# Patient Record
Sex: Female | Born: 1990 | Race: Black or African American | Hispanic: No | Marital: Single | Smoking: Former smoker
Health system: Southern US, Community
[De-identification: ages and names within clinical notes are randomized; demographics above are authoritative.]

## PROBLEM LIST (undated history)

## (undated) DIAGNOSIS — O1490 Unspecified pre-eclampsia, unspecified trimester: Secondary | ICD-10-CM

---

## 2017-04-30 ENCOUNTER — Encounter (HOSPITAL_COMMUNITY): Payer: Self-pay | Admitting: Emergency Medicine

## 2017-04-30 ENCOUNTER — Emergency Department (HOSPITAL_COMMUNITY)
Admission: EM | Admit: 2017-04-30 | Discharge: 2017-04-30 | Disposition: A | Discharge status: Home / Self Care | Payer: Medicaid Other | Attending: Physician Assistant | Admitting: Physician Assistant

## 2017-04-30 ENCOUNTER — Emergency Department (HOSPITAL_COMMUNITY): Payer: Medicaid Other

## 2017-04-30 ENCOUNTER — Other Ambulatory Visit: Payer: Self-pay

## 2017-04-30 DIAGNOSIS — M25562 Pain in left knee: Secondary | ICD-10-CM

## 2017-04-30 DIAGNOSIS — F17200 Nicotine dependence, unspecified, uncomplicated: Secondary | ICD-10-CM | POA: Insufficient documentation

## 2017-04-30 MED ORDER — IBUPROFEN 400 MG PO TABS
600.0000 mg | ORAL_TABLET | Freq: Once | ORAL | Status: AC
  Administered 2017-04-30: 600 mg via ORAL
  Filled 2017-04-30: qty 1

## 2017-04-30 NOTE — Discharge Instructions (Signed)
X-ray shows no evidence of fracture or dislocation, likely soft tissue injury from the fall.  Please treat with ibuprofen and Tylenol, ice, elevation and rest.  Pain should improve over the next few days if pain is not getting better please follow-up with primary care.  If you develop severe pain in the knee, swelling, redness, warmth numbness or tingling please return to the ED for reevaluation.

## 2017-04-30 NOTE — ED Provider Notes (Signed)
[facility] EMERGENCY [facility] Provider Note   CSN: [id number] Arrival date & time: 04/30/17  0844     History   Chief Complaint Chief Complaint  Patient presents with  . Knee Injury    HPI  Ariel Brown is a 27 y.o. obese female who is otherwise healthy, presents to the ED today for evaluation of left knee pain.  Patient reports she slipped yesterday and fell landing on her knee, since then she has had pain primarily with movement.  Patient has not tried any pain medications prior to arrival.  She has been ambulatory without difficulty.  Patient denies any numbness tingling or weakness to the lower extremity.  No swelling, redness or warmth.       History reviewed. No pertinent past medical history.  There are no active problems to display for this patient.   History reviewed. No pertinent surgical history.  OB History    No data available       Home Medications    Prior to Admission medications   Not on File    Family History History reviewed. No pertinent family history.  Social History Social History   Tobacco Use  . Smoking status: Current Every Day Smoker  . Smokeless tobacco: Current User  Substance Use Topics  . Alcohol use: Not on file  . Drug use: Not on file     Allergies   Patient has no known allergies.   Review of Systems Review of Systems  Constitutional: Negative for chills and fever.  Musculoskeletal: Positive for arthralgias (L knee). Negative for gait problem and joint swelling.  Neurological: Negative for weakness and numbness.     Physical Exam Updated Vital Signs BP (!) 119/53   Pulse 81   Temp 98.3 F (36.8 C) (Oral)   Resp 18   LMP 03/29/2017   SpO2 99%   Physical Exam  Constitutional: She appears well-developed and well-nourished. No distress.  HENT:  Head: Normocephalic and atraumatic.  Eyes: Right eye exhibits no discharge. Left eye exhibits no discharge.  Pulmonary/Chest: Effort  normal. No respiratory distress.  Musculoskeletal:  Left knee with mild tenderness to palpation over the patella, no appreciable deformity, swelling, redness or warmth, full extension and flexion of the knee with mild discomfort, DP and TP pulses 2+, sensation intact, normal strength with dorsi and plantarflexion as well as flexion and extension of the knee.  Neurological: She is alert. Coordination normal.  Skin: Skin is warm and dry. She is not diaphoretic.  Psychiatric: She has a normal mood and affect. Her behavior is normal.  Nursing note and vitals reviewed.    ED Treatments / Results  Labs (all labs ordered are listed, but only abnormal results are displayed) Labs Reviewed - No data to display  EKG  EKG Interpretation None       Radiology No results found.  Procedures Procedures (including critical care time)  Medications Ordered in ED Medications  ibuprofen (ADVIL,MOTRIN) tablet 600 mg (600 mg Oral Given 04/30/17 0941)     Initial Impression / Assessment and Plan / ED Course  I have reviewed the triage vital signs and the nursing notes.  Pertinent labs & imaging results that were available during my care of the patient were reviewed by me and considered in my medical decision making (see chart for details).  Patient presents for evaluation of left knee pain after fall yesterday, no swelling noted.  Patient has been ambulatory without difficulty.  Left lower extremity is  neurovascularly intact and x-ray shows no evidence of acute fracture or other abnormality.  Likely soft tissue injury recommend NSAIDs and rice therapy.  Patient stable for discharge home follow-up with her primary care doctor.  Return precautions discussed.  Final Clinical Impressions(s) / ED Diagnoses   Final diagnoses:  Acute pain of left knee    ED Discharge Orders    None       Legrand Rams 04/30/17 1015    Abelino Derrick, MD 05/03/17 1947

## 2017-04-30 NOTE — ED Triage Notes (Signed)
Pt sts she slipped and fell onto her L knee. Pain with movement. No additional swelling noted. Pt sts fall occurred yesterday. No problem with ambulation.

## 2017-04-30 NOTE — ED Notes (Signed)
Patient transported to X-ray 

## 2017-06-27 ENCOUNTER — Emergency Department (HOSPITAL_COMMUNITY)
Admission: EM | Admit: 2017-06-27 | Discharge: 2017-06-27 | Disposition: A | Discharge status: Home / Self Care | Payer: Medicaid Other | Source: Home / Self Care | Attending: Emergency Medicine | Admitting: Emergency Medicine

## 2017-06-27 ENCOUNTER — Other Ambulatory Visit: Payer: Self-pay

## 2017-06-27 ENCOUNTER — Emergency Department (HOSPITAL_COMMUNITY)
Admission: EM | Admit: 2017-06-27 | Discharge: 2017-06-27 | Disposition: A | Discharge status: Home / Self Care | Payer: Medicaid Other | Attending: Emergency Medicine | Admitting: Emergency Medicine

## 2017-06-27 ENCOUNTER — Encounter (HOSPITAL_COMMUNITY): Payer: Self-pay

## 2017-06-27 ENCOUNTER — Encounter (HOSPITAL_COMMUNITY): Payer: Self-pay | Admitting: Emergency Medicine

## 2017-06-27 DIAGNOSIS — S39012A Strain of muscle, fascia and tendon of lower back, initial encounter: Secondary | ICD-10-CM | POA: Diagnosis not present

## 2017-06-27 DIAGNOSIS — Y929 Unspecified place or not applicable: Secondary | ICD-10-CM | POA: Diagnosis not present

## 2017-06-27 DIAGNOSIS — L03011 Cellulitis of right finger: Secondary | ICD-10-CM | POA: Diagnosis not present

## 2017-06-27 DIAGNOSIS — X500XXA Overexertion from strenuous movement or load, initial encounter: Secondary | ICD-10-CM | POA: Insufficient documentation

## 2017-06-27 DIAGNOSIS — Y999 Unspecified external cause status: Secondary | ICD-10-CM | POA: Diagnosis not present

## 2017-06-27 DIAGNOSIS — F17200 Nicotine dependence, unspecified, uncomplicated: Secondary | ICD-10-CM | POA: Diagnosis not present

## 2017-06-27 DIAGNOSIS — S3982XA Other specified injuries of lower back, initial encounter: Secondary | ICD-10-CM | POA: Diagnosis present

## 2017-06-27 DIAGNOSIS — Y939 Activity, unspecified: Secondary | ICD-10-CM | POA: Diagnosis not present

## 2017-06-27 MED ORDER — NAPROXEN 500 MG PO TABS: 500.0000 mg | ORAL_TABLET | Freq: Two times a day (BID) | ORAL | 0 refills | Status: DC

## 2017-06-27 MED ORDER — CEPHALEXIN 500 MG PO CAPS: 500.0000 mg | ORAL_CAPSULE | Freq: Three times a day (TID) | ORAL | 0 refills | Status: DC

## 2017-06-27 MED ORDER — CEPHALEXIN 250 MG PO CAPS
500.0000 mg | ORAL_CAPSULE | Freq: Once | ORAL | Status: AC
  Administered 2017-06-27: 500 mg via ORAL
  Filled 2017-06-27: qty 2

## 2017-06-27 NOTE — ED Notes (Signed)
ED Provider at bedside. 

## 2017-06-27 NOTE — Discharge Instructions (Addendum)
Naprosyn for pain.  Keep your finger elevated.  Warm soapy water soaks several times a day.  Take antibiotic as prescribed until all gone.  Do your back exercises.  Follow-up with family doctor.

## 2017-06-27 NOTE — ED Provider Notes (Signed)
[facility] Provider Note   CSN: [id number] Arrival date & time: 06/27/17  2012     History   Chief Complaint Chief Complaint  Patient presents with  . Back Pain  . Hand Pain    HPI Ariel Brown is a 27 y.o. female.  HPI Ariel HarmanMary Aurel Klinkner is a 27 y.o. female presents to emergency department with complaints.  Patient states she has had lower back pain for the last several days.  She states that it is painful in the lower back and mainly on the right, worsened with leaning forward and picking things off the floor.  Denies any known injuries.  Denies any pain radiating down her legs.  Denies any trouble controlling bowels or bladder.  Denies any fever or chills.  No urinary symptoms.  Has not taken any medications prior to coming in.  Patient's other complaint is pain in her right index finger.  Patient states she bites her nails and feels like she may have ingrown nail or an infection.  She states this started several days ago, now much worse.  Denies any drainage.  States finger feels "like it is pulsating."   History reviewed. No pertinent past medical history.  There are no active problems to display for this patient.   History reviewed. No pertinent surgical history.   OB History   None      Home Medications    Prior to Admission medications   Not on File    Family History No family history on file.  Social History Social History   Tobacco Use  . Smoking status: Current Every Day Smoker  . Smokeless tobacco: Current User  Substance Use Topics  . Alcohol use: Yes    Comment: social  . Drug use: Not Currently     Allergies   Patient has no known allergies.   Review of Systems Review of Systems  Constitutional: Negative for chills and fever.  Respiratory: Negative for cough, chest tightness and shortness of breath.   Cardiovascular: Negative for chest pain, palpitations and leg swelling.    Gastrointestinal: Negative for abdominal pain, diarrhea, nausea and vomiting.  Genitourinary: Negative for dysuria, flank pain, pelvic pain, vaginal bleeding, vaginal discharge and vaginal pain.  Musculoskeletal: Positive for arthralgias and back pain. Negative for myalgias, neck pain and neck stiffness.  Skin: Positive for color change. Negative for rash.  Neurological: Negative for dizziness, weakness and headaches.  All other systems reviewed and are negative.    Physical Exam Updated Vital Signs BP (!) 133/92 (BP Location: Left Arm)   Pulse 95   Temp 98.3 F (36.8 C) (Oral)   Resp 20   Ht 5\' 5"  (1.651 m)   Wt 127 kg (280 lb)   LMP 06/23/2017   SpO2 100%   BMI 46.59 kg/m   Physical Exam  Constitutional: She appears well-developed and well-nourished. No distress.  HENT:  Head: Normocephalic.  Eyes: Conjunctivae are normal.  Neck: Neck supple.  Cardiovascular: Normal rate, regular rhythm and normal heart sounds.  Pulmonary/Chest: Effort normal and breath sounds normal. No respiratory distress. She has no wheezes. She has no rales.  Abdominal: Soft. Bowel sounds are normal. She exhibits no distension. There is no tenderness. There is no rebound.  Musculoskeletal: She exhibits no edema.  Tenderness to palpation over lower midline lumbar spine and right paraspinal muscles.  Pain with forward flexion of the torso.  Paronychia to the right index finger.  Full range of motion  of the joint.  Neurological: She is alert.  5/5 and equal lower extremity strength. 2+ and equal patellar reflexes bilaterally. Pt able to dorsiflex bilateral toes and feet with good strength against resistance. Equal sensation bilaterally over thighs and lower legs.   Skin: Skin is warm and dry.  Psychiatric: She has a normal mood and affect. Her behavior is normal.  Nursing note and vitals reviewed.    ED Treatments / Results  Labs (all labs ordered are listed, but only abnormal results are  displayed) Labs Reviewed - No data to display  EKG None  Radiology No results found.  Procedures Drain paronychia Date/Time: 06/27/2017 10:54 PM Performed by: Jaynie Crumble, PA-C Authorized by: Jaynie Crumble, PA-C  Consent: Verbal consent obtained. Risks and benefits: risks, benefits and alternatives were discussed Consent given by: patient Patient understanding: patient states understanding of the procedure being performed Patient consent: the patient's understanding of the procedure matches consent given Site marked: the operative site was marked Required items: required blood products, implants, devices, and special equipment available Patient identity confirmed: verbally with patient Time out: Immediately prior to procedure a "time out" was called to verify the correct patient, procedure, equipment, support staff and site/side marked as required. Preparation: Patient was prepped and draped in the usual sterile fashion. Local anesthesia used: no  Anesthesia: Local anesthesia used: no  Sedation: Patient sedated: no  Patient tolerance: Patient tolerated the procedure well with no immediate complications Comments: Large white purulent drainage    (including critical care time)  Medications Ordered in ED Medications - No data to display   Initial Impression / Assessment and Plan / ED Course  I have reviewed the triage vital signs and the nursing notes.  Pertinent labs & imaging results that were available during my care of the patient were reviewed by me and considered in my medical decision making (see chart for details).     Patient in emergency department with back pain.  Based on exam, back pain is most likely musculoskeletal.  She has no urinary symptoms or abdominal pain.  No CVA tenderness.  Pain is worsened with forward flexion.  No pain with straight leg raise.  Question muscle strain.  Advised to start NSAIDs.  We will give her enough for back  exercises.  Patient also has a paronychia to right index finger which was drained in emergency department.  Will start on antibiotic, discussed warm soapy water soaks, follow-up as needed.  Patient agreed.  Vitals:   06/27/17 2046 06/27/17 2054  BP: (!) 133/92   Pulse: 95   Resp: 20   Temp: 98.3 F (36.8 C)   TempSrc: Oral   SpO2: 100%   Weight:  127 kg (280 lb)  Height:  5\' 5"  (1.651 m)     Final Clinical Impressions(s) / ED Diagnoses   Final diagnoses:  Lumbosacral strain, initial encounter  Paronychia of right index finger    ED Discharge Orders        Ordered    cephALEXin (KEFLEX) 500 MG capsule  3 times daily     06/27/17 2310    naproxen (NAPROSYN) 500 MG tablet  2 times daily     06/27/17 2310       Jaynie Crumble, PA-C 06/27/17 2312    Mesner, Barbara Cower, MD 07/01/17 2137

## 2017-06-27 NOTE — ED Notes (Signed)
Pt departed in NAD, refused use of wheelchair.  

## 2017-06-27 NOTE — ED Notes (Addendum)
Patient inquired about wait time. This tech explained how wait times worked. Patient understanding. Patient then approached registration and stated she did not want to wait and left.

## 2017-06-27 NOTE — ED Triage Notes (Signed)
Pt states she has been having lower back pain for a couple of weeks, worse if she lays flat for a long period of time. Pt also c/o pain and swelling in left index finger. 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

## 2017-06-27 NOTE — ED Triage Notes (Signed)
Pt here earlier for same but LWBS, pt reports lower back pain X few weeks, pain is worse with bending over and moving. Pt also reports R finger pain, appears to might have ingrown nail.

## 2020-07-25 ENCOUNTER — Ambulatory Visit (HOSPITAL_COMMUNITY)
Admission: EM | Admit: 2020-07-25 | Discharge: 2020-07-25 | Disposition: A | Discharge status: Home / Self Care | Payer: Medicaid Other | Attending: Emergency Medicine | Admitting: Emergency Medicine

## 2020-07-25 ENCOUNTER — Encounter (HOSPITAL_COMMUNITY): Payer: Self-pay

## 2020-07-25 DIAGNOSIS — O169 Unspecified maternal hypertension, unspecified trimester: Secondary | ICD-10-CM

## 2020-07-25 MED ORDER — NIFEDIPINE ER OSMOTIC RELEASE 30 MG PO TB24: 30.0000 mg | ORAL_TABLET | Freq: Every day | ORAL | 1 refills | Status: DC

## 2020-07-25 NOTE — ED Triage Notes (Signed)
Pt c/o HBP, lower back pain and c/o seeing spots X 2 months. She states this started happening after giving birth.

## 2020-07-25 NOTE — ED Provider Notes (Signed)
[facility] Provider Note  ____________________________________________  Time seen: Approximately 8:27 AM  I have reviewed the triage vital signs and the nursing notes.   HISTORY  Chief Complaint Hypertension, Back Pain, and Eye Problem    HPI Ariel Brown is a 30 y.o. female who presents to the urgent care complaining of hypertension.  Patient states that she delivered 2 months ago.  Roughly a month ago, patient noticed that she was becoming symptomatic with headaches, body aches, generalized malaise.  She states that she had had an issue during her delivery with high blood pressure, was having magnesium administered during her delivery.  Patient states that she had no history of high blood pressure, never been diagnosed with preeclampsia/eclampsia during her pregnancy.  She states that the term eclampsia was mentioned while she was delivering but did not have any complications and was not started on antihypertensives on discharge.  Patient states that 3 to 4 weeks after delivering she started having symptoms, went to [facility] to check her blood pressure and it was 217 systolic and over 100 diastolic.  Patient states that she had 1 follow-up with OB/GYN but has not seen OB/GYN or primary care since her initial follow-up.  Patient states that she has noticed that her blood pressure has remained elevated but she is not currently having symptoms to include headaches, visual changes, numbness or weakness, seizure activity, chest pain, shortness of breath.  No medications again for this complaint.  She has not seen OB or primary care for this complaint either.         History reviewed. No pertinent past medical history.  There are no problems to display for this patient.   History reviewed. No pertinent surgical history.  Prior to Admission medications   Medication Sig Start Date End Date Taking? Authorizing Provider  NIFEdipine (PROCARDIA-XL/NIFEDICAL-XL) 30 MG 24 hr  tablet Take 1 tablet (30 mg total) by mouth daily. 07/25/20  Yes Amyri Frenz, Delorise Royals, PA-C  cephALEXin (KEFLEX) 500 MG capsule Take 1 capsule (500 mg total) by mouth 3 (three) times daily. 06/27/17   Kirichenko, Tatyana, PA-C  naproxen (NAPROSYN) 500 MG tablet Take 1 tablet (500 mg total) by mouth 2 (two) times daily. 06/27/17   Jaynie Crumble, PA-C    Allergies Patient has no known allergies.  History reviewed. No pertinent family history.  Social History Social History   Tobacco Use  . Smoking status: Current Every Day Smoker  . Smokeless tobacco: Current User  Substance Use Topics  . Alcohol use: Yes    Comment: social  . Drug use: Not Currently     Review of Systems  Constitutional: No fever/chills.  Positive for postpartum hypertension Eyes: No visual changes. No discharge ENT: No upper respiratory complaints. Cardiovascular: no chest pain. Respiratory: no cough. No SOB. Gastrointestinal: No abdominal pain.  No nausea, no vomiting.  No diarrhea.  No constipation. Genitourinary: Negative for dysuria. No hematuria Musculoskeletal: Negative for musculoskeletal pain. Skin: Negative for rash, abrasions, lacerations, ecchymosis. Neurological: Negative for headaches, focal weakness or numbness.  10 System ROS otherwise negative.  ____________________________________________   PHYSICAL EXAM:  VITAL SIGNS: ED Triage Vitals [07/25/20 0827]  Enc Vitals Group     BP      Pulse Rate 76     Resp 18     Temp      Temp src      SpO2 100 %     Weight      Height  Head Circumference      Peak Flow      Pain Score      Pain Loc      Pain Edu?      Excl. in GC?      Constitutional: Alert and oriented. Well appearing and in no acute distress. Eyes: Conjunctivae are normal. PERRL. EOMI. Head: Atraumatic. ENT:      Ears:       Nose: No congestion/rhinnorhea.      Mouth/Throat: Mucous membranes are moist.  Neck: No stridor.    Cardiovascular: Normal rate,  regular rhythm. Normal S1 and S2.  Good peripheral circulation. Respiratory: Normal respiratory effort without tachypnea or retractions. Lungs CTAB. Good air entry to the bases with no decreased or absent breath sounds. Gastrointestinal: Bowel sounds 4 quadrants. Soft and nontender to palpation. No guarding or rigidity. No palpable masses. No distention. No CVA tenderness. Musculoskeletal: Full range of motion to all extremities. No gross deformities appreciated. Neurologic:  Normal speech and language. No gross focal neurologic deficits are appreciated.  Skin:  Skin is warm, dry and intact. No rash noted. Psychiatric: Mood and affect are normal. Speech and behavior are normal. Patient exhibits appropriate insight and judgement.   ____________________________________________   LABS (all labs ordered are listed, but only abnormal results are displayed)  Labs Reviewed - No data to display ____________________________________________  EKG   ____________________________________________  RADIOLOGY   No results found.  ____________________________________________    PROCEDURES  Procedure(s) performed:    Procedures    Medications - No data to display   ____________________________________________   INITIAL IMPRESSION / ASSESSMENT AND PLAN / ED COURSE  Pertinent labs & imaging results that were available during my care of the patient were reviewed by me and considered in my medical decision making (see chart for details).  Review of the [place] CSRS was performed in accordance of the [facility] prior to dispensing any controlled drugs.           Patient's diagnosis is consistent with postpartum hypertension.  Patient presented to the emergency department complaining of hypertension following her pregnancy.  Patient states that she delivered 2 months ago.  Roughly 3 to 4 weeks after delivery she started having issues with her blood pressure.  Patient states that during her  delivery she became extremely hypertensive, received IV magnesium and they discussed the term eclampsia during her delivery.  She had no eclampsia/preeclampsia during her pregnancy.  She denied any hypertension prior to pregnancy either.  She was not started on any antihypertensive medications after her delivery and approximately 3 to 4 weeks later she started to develop symptoms.  Patient states that her blood pressure has remained elevated but she has not been as symptomatic as she was initially.  She currently denies any headache, vision changes, chest pain, shortness of breath, abdominal pain, nausea vomiting.  Patient had 1 follow-up with her OB/GYN following pregnancy but has not followed up since.  Currently patient's blood pressure is 160/100.  We discussed at length the concerning signs and symptoms and my recommendation for follow-up with emergency department for labs for postpartum eclampsia.  Patient currently does not want to follow-up in the emergency department, states that she will contact her OB/GYN for follow-up.  I will start her on an antihypertensive medication but I have given strict ED precautions for any new or worsening symptoms.  If there is any delay in seeing OB/GYN, specifically longer than 3 to 4 days she should go ahead and follow-up  with the emergency department.  Patient verbalizes understanding and agreement with this plan..  Patient is given ED precautions to return to the ED for any worsening or new symptoms.   I was able to review patient's medical records.  Patient delivered at [facility] and [place].  Patient did have severe preeclampsia according to her inpatient notes from delivery.  At this time we discussed patient's symptoms, and she will proceed to emergency department for further evaluation.     ____________________________________________  FINAL CLINICAL IMPRESSION(S) / ED DIAGNOSES  Final diagnoses:  Hypertension during pregnancy, antepartum, unspecified  hypertension in pregnancy type      NEW MEDICATIONS STARTED DURING THIS VISIT:  ED Discharge Orders         Ordered    NIFEdipine (PROCARDIA-XL/NIFEDICAL-XL) 30 MG 24 hr tablet  Daily        07/25/20 0918              This chart was dictated using voice recognition software/Dragon. Despite best efforts to proofread, errors can occur which can change the meaning. Any change was purely unintentional.    Racheal Patches, PA-C 07/25/20 [phone]

## 2020-07-31 ENCOUNTER — Emergency Department (HOSPITAL_COMMUNITY): Payer: Medicaid Other

## 2020-07-31 ENCOUNTER — Emergency Department (HOSPITAL_COMMUNITY)
Admission: EM | Admit: 2020-07-31 | Discharge: 2020-08-01 | Discharge status: Against Medical Advice | Payer: Medicaid Other | Attending: Physician Assistant | Admitting: Physician Assistant

## 2020-07-31 ENCOUNTER — Encounter (HOSPITAL_COMMUNITY): Payer: Self-pay | Admitting: Emergency Medicine

## 2020-07-31 ENCOUNTER — Other Ambulatory Visit: Payer: Self-pay

## 2020-07-31 DIAGNOSIS — Z5321 Procedure and treatment not carried out due to patient leaving prior to being seen by health care provider: Secondary | ICD-10-CM | POA: Insufficient documentation

## 2020-07-31 DIAGNOSIS — G43909 Migraine, unspecified, not intractable, without status migrainosus: Secondary | ICD-10-CM | POA: Diagnosis not present

## 2020-07-31 DIAGNOSIS — M25532 Pain in left wrist: Secondary | ICD-10-CM | POA: Insufficient documentation

## 2020-07-31 DIAGNOSIS — R55 Syncope and collapse: Secondary | ICD-10-CM | POA: Insufficient documentation

## 2020-07-31 HISTORY — DX: Unspecified pre-eclampsia, unspecified trimester: O14.90

## 2020-07-31 LAB — CBC WITH DIFFERENTIAL/PLATELET
Abs Immature Granulocytes: 0.01 10*3/uL (ref 0.00–0.07)
Basophils Absolute: 0 10*3/uL (ref 0.0–0.1)
Basophils Relative: 1 %
Eosinophils Absolute: 0.1 10*3/uL (ref 0.0–0.5)
Eosinophils Relative: 1 %
HCT: 42.2 % (ref 36.0–46.0)
Hemoglobin: 13.2 g/dL (ref 12.0–15.0)
Immature Granulocytes: 0 %
Lymphocytes Relative: 54 %
Lymphs Abs: 2.4 10*3/uL (ref 0.7–4.0)
MCH: 28.1 pg (ref 26.0–34.0)
MCHC: 31.3 g/dL (ref 30.0–36.0)
MCV: 90 fL (ref 80.0–100.0)
Monocytes Absolute: 0.3 10*3/uL (ref 0.1–1.0)
Monocytes Relative: 7 %
Neutro Abs: 1.6 10*3/uL — ABNORMAL LOW (ref 1.7–7.7)
Neutrophils Relative %: 37 %
Platelets: 156 10*3/uL (ref 150–400)
RBC: 4.69 MIL/uL (ref 3.87–5.11)
RDW: 15.4 % (ref 11.5–15.5)
WBC: 4.3 10*3/uL (ref 4.0–10.5)
nRBC: 0 % (ref 0.0–0.2)

## 2020-07-31 LAB — COMPREHENSIVE METABOLIC PANEL
ALT: 12 U/L (ref 0–44)
AST: 13 U/L — ABNORMAL LOW (ref 15–41)
Albumin: 3.6 g/dL (ref 3.5–5.0)
Alkaline Phosphatase: 51 U/L (ref 38–126)
Anion gap: 5 (ref 5–15)
BUN: 6 mg/dL (ref 6–20)
CO2: 28 mmol/L (ref 22–32)
Calcium: 9 mg/dL (ref 8.9–10.3)
Chloride: 105 mmol/L (ref 98–111)
Creatinine, Ser: 0.92 mg/dL (ref 0.44–1.00)
GFR, Estimated: >60 mL/min (ref >60)
Glucose, Bld: 100 mg/dL — ABNORMAL HIGH (ref 70–99)
Potassium: 3.8 mmol/L (ref 3.5–5.1)
Sodium: 138 mmol/L (ref 135–145)
Total Bilirubin: 0.6 mg/dL (ref 0.3–1.2)
Total Protein: 6.8 g/dL (ref 6.5–8.1)

## 2020-07-31 LAB — I-STAT BETA HCG BLOOD, ED (MC, WL, AP ONLY): I-stat hCG, quantitative: <5 m[IU]/mL (ref <5)

## 2020-07-31 NOTE — ED Notes (Signed)
Pt left because she needed to catch the bus home

## 2020-07-31 NOTE — ED Provider Notes (Signed)
Emergency Medicine Provider Triage Evaluation Note  Ariel Brown , a 30 y.o. female  was evaluated in triage.  Pt complains of migraines for the last 4 days, left wrist pain.  Patient states that she gave birth to her daughter on March 25 of this year, and has been having severe migraines ever since.  She has a history of preeclampsia.  Recently moved here from [place].  She was prescribed nifedipine, however when she went to the pharmacy to pick it up she was told that it was out of stock.  She has not taken it in the last 2 months.  She endorses occasional blurry vision, nausea.  She felt presyncopal today due to the pain.  She also complains of wrist pain which has been ongoing for the last 3 days.  Denies any known falls or injuries.  Thinks she may have "slept on it wrong".  Denies any chest pain or shortness of breath  Review of Systems  Positive: As above Negative: As above  Physical Exam  BP (!) 167/121 (BP Location: Left Arm)   Pulse 78   Temp 98.8 F (37.1 C)   Resp 18   LMP 07/23/2020 (Exact Date)   SpO2 100%  Gen:   Awake, no distress   Resp:  Normal effort  MSK:   Moves extremities without difficulty  Other:  CNII-12 intact, moving all 4 extremities without difficulty   Medical Decision Making  Medically screening exam initiated at 6:19 PM.  Appropriate orders placed.  Velva Harman was informed that the remainder of the evaluation will be completed by another provider, this initial triage assessment does not replace that evaluation, and the importance of remaining in the ED until their evaluation is complete.    Mare Ferrari, PA-C 07/31/20 1821    Terrilee Files, MD 08/01/20 [phone]

## 2020-07-31 NOTE — ED Triage Notes (Signed)
C/o headache x 4 days and L wrist pain x 3 days.  Reports migraines since having her daughter in March.  Intermittent nausea and blurred vision.  Felt like she was going to pass out today.  No known injury.  Hx of preeclampsia.

## 2020-09-14 ENCOUNTER — Encounter (HOSPITAL_COMMUNITY): Payer: Self-pay | Admitting: Emergency Medicine

## 2020-09-14 ENCOUNTER — Other Ambulatory Visit: Payer: Self-pay

## 2020-09-14 ENCOUNTER — Ambulatory Visit (HOSPITAL_COMMUNITY)
Admission: EM | Admit: 2020-09-14 | Discharge: 2020-09-14 | Disposition: A | Discharge status: Home / Self Care | Payer: Medicaid Other | Attending: Emergency Medicine | Admitting: Emergency Medicine

## 2020-09-14 DIAGNOSIS — M654 Radial styloid tenosynovitis [de Quervain]: Secondary | ICD-10-CM

## 2020-09-14 DIAGNOSIS — S63502A Unspecified sprain of left wrist, initial encounter: Secondary | ICD-10-CM | POA: Diagnosis not present

## 2020-09-14 MED ORDER — PREDNISONE 20 MG PO TABS: 40.0000 mg | ORAL_TABLET | Freq: Every day | ORAL | 0 refills | Status: AC

## 2020-09-14 NOTE — ED Triage Notes (Signed)
Left wrist pain, radial aspect of wrist.  Denies injury.  Pain for a week.  Limited movement of left thumb.  Movement of fingers makes wrist hurt, but especially the thumb. Patient is left handed.

## 2020-09-14 NOTE — ED Provider Notes (Signed)
[facility]    CSN: [id number] Arrival date & time: 09/14/20  1323      History   Chief Complaint Chief Complaint  Patient presents with   Wrist Pain    HPI Ariel Brown is a 30 y.o. female.   Patient here for evaluation of the left wrist pain and some pain that has been ongoing for the past several months.  Reports being evaluated in the emergency room and having an x-ray in May but did not stay for the full evaluation.  Reports pain is aching and constant and worse with movement.  Reports taking OTC medications with minimal relief.  Denies any trauma, injury, or other precipitating event.  Denies any fevers, chest pain, shortness of breath, N/V/D, numbness, tingling, weakness, abdominal pain, or headaches.     The history is provided by the patient.  Wrist Pain   Past Medical History:  Diagnosis Date   Preeclampsia     There are no problems to display for this patient.   History reviewed. No pertinent surgical history.  OB History   No obstetric history on file.      Home Medications    Prior to Admission medications   Medication Sig Start Date End Date Taking? Authorizing Provider  predniSONE (DELTASONE) 20 MG tablet Take 2 tablets (40 mg total) by mouth daily for 5 days. 09/14/20 09/19/20 Yes Ivette Loyal, NP  cephALEXin (KEFLEX) 500 MG capsule Take 1 capsule (500 mg total) by mouth 3 (three) times daily. Patient not taking: Reported on 09/14/2020 06/27/17   Jaynie Crumble, PA-C  naproxen (NAPROSYN) 500 MG tablet Take 1 tablet (500 mg total) by mouth 2 (two) times daily. 06/27/17   Kirichenko, Lemont Fillers, PA-C  NIFEdipine (PROCARDIA-XL/NIFEDICAL-XL) 30 MG 24 hr tablet Take 1 tablet (30 mg total) by mouth daily. 07/25/20   Cuthriell, Delorise Royals, PA-C    Family History Family History  Problem Relation Age of Onset   Healthy Sister     Social History Social History   Tobacco Use   Smoking status: Former    Pack years: 0.00    Types:  Cigarettes   Smokeless tobacco: Current  Vaping Use   Vaping Use: Every day  Substance Use Topics   Alcohol use: Yes    Comment: social   Drug use: Not Currently     Allergies   Patient has no known allergies.   Review of Systems Review of Systems  Musculoskeletal:  Positive for arthralgias and joint swelling.  All other systems reviewed and are negative.   Physical Exam Triage Vital Signs ED Triage Vitals  Enc Vitals Group     BP 09/14/20 1536 (!) 152/99     Pulse Rate 09/14/20 1536 85     Resp 09/14/20 1536 20     Temp 09/14/20 1536 99.7 F (37.6 C)     Temp Source 09/14/20 1536 Oral     SpO2 09/14/20 1536 100 %     Weight --      Height --      Head Circumference --      Peak Flow --      Pain Score 09/14/20 1537 10     Pain Loc --      Pain Edu? --      Excl. in GC? --    No data found.  Updated Vital Signs BP (!) 152/99 (BP Location: Right Arm) Comment (BP Location): regular/forearm  Pulse 85   Temp 99.7 F (37.6  C) (Oral)   Resp 20   SpO2 100%   Visual Acuity Right Eye Distance:   Left Eye Distance:   Bilateral Distance:    Right Eye Near:   Left Eye Near:    Bilateral Near:     Physical Exam Vitals and nursing note reviewed.  Constitutional:      General: She is not in acute distress.    Appearance: Normal appearance. She is not ill-appearing, toxic-appearing or diaphoretic.  HENT:     Head: Normocephalic and atraumatic.  Eyes:     Conjunctiva/sclera: Conjunctivae normal.  Cardiovascular:     Rate and Rhythm: Normal rate.     Pulses: Normal pulses.  Pulmonary:     Effort: Pulmonary effort is normal.  Abdominal:     General: Abdomen is flat.  Musculoskeletal:     Left wrist: Tenderness present. No deformity, bony tenderness, snuff box tenderness or crepitus. Decreased range of motion (thumb due to pain). Normal pulse.     Cervical back: Normal range of motion.  Skin:    General: Skin is warm and dry.  Neurological:     General:  No focal deficit present.     Mental Status: She is alert and oriented to person, place, and time.  Psychiatric:        Mood and Affect: Mood normal.     UC Treatments / Results  Labs (all labs ordered are listed, but only abnormal results are displayed) Labs Reviewed - No data to display  EKG   Radiology No results found.  Procedures Procedures (including critical care time)  Medications Ordered in UC Medications - No data to display  Initial Impression / Assessment and Plan / UC Course  I have reviewed the triage vital signs and the nursing notes.  Pertinent labs & imaging results that were available during my care of the patient were reviewed by me and considered in my medical decision making (see chart for details).    Assessment negative for red flags or concerns.  X-ray in May with no acute bony abnormalities.  Possible de Quervain's tenosynovitis.  We will treat with prednisone daily for the next 5 days and a wrist splint for comfort.  Patient given wrist exercises and encouraged to do gentle stretching.  Recommend rest, ice, compression, and elevation.  Follow-up with orthopedics if symptoms do not improve in the next few weeks. Final Clinical Impressions(s) / UC Diagnoses   Final diagnoses:  Sprain of left wrist, initial encounter  Radial styloid tenosynovitis (de quervain)     Discharge Instructions      Take the prednisone daily for the next 5 days. After 5 days, you can take Aleve or Ibuprofen as needed for pain.   Wear the wrist splint for comfort.  Do gentle stretching and exercises.   Rest as much as possible Ice for 10-15 minutes every 4-6 hours as needed for pain and swelling Compression- use an ace bandage or splint for comfort Elevate above your hip/heart when sitting and laying down  Follow up with sports medicine or orthopedics if symptoms do not improve in the next few weeks.       ED Prescriptions     Medication Sig Dispense Auth.  Provider   predniSONE (DELTASONE) 20 MG tablet Take 2 tablets (40 mg total) by mouth daily for 5 days. 10 tablet Ivette Loyal, NP      PDMP not reviewed this encounter.   Ivette Loyal, NP 09/14/20 1616

## 2020-09-14 NOTE — Discharge Instructions (Addendum)
Take the prednisone daily for the next 5 days. After 5 days, you can take Aleve or Ibuprofen as needed for pain.   Wear the wrist splint for comfort.  Do gentle stretching and exercises.   Rest as much as possible Ice for 10-15 minutes every 4-6 hours as needed for pain and swelling Compression- use an ace bandage or splint for comfort Elevate above your hip/heart when sitting and laying down  Follow up with sports medicine or orthopedics if symptoms do not improve in the next few weeks.

## 2020-11-11 ENCOUNTER — Other Ambulatory Visit: Payer: Self-pay

## 2020-11-11 ENCOUNTER — Encounter (HOSPITAL_COMMUNITY): Payer: Self-pay | Admitting: Emergency Medicine

## 2020-11-11 ENCOUNTER — Ambulatory Visit (HOSPITAL_COMMUNITY)
Admission: EM | Admit: 2020-11-11 | Discharge: 2020-11-11 | Disposition: A | Discharge status: Home / Self Care | Payer: Medicaid Other | Attending: Family Medicine | Admitting: Family Medicine

## 2020-11-11 DIAGNOSIS — R519 Headache, unspecified: Secondary | ICD-10-CM

## 2020-11-11 DIAGNOSIS — R030 Elevated blood-pressure reading, without diagnosis of hypertension: Secondary | ICD-10-CM | POA: Diagnosis not present

## 2020-11-11 DIAGNOSIS — H53149 Visual discomfort, unspecified: Secondary | ICD-10-CM | POA: Diagnosis not present

## 2020-11-11 MED ORDER — CYCLOBENZAPRINE HCL 5 MG PO TABS: 5.0000 mg | ORAL_TABLET | Freq: Every evening | ORAL | 0 refills | Status: DC | PRN

## 2020-11-11 MED ORDER — PREDNISONE 20 MG PO TABS: 20.0000 mg | ORAL_TABLET | Freq: Every day | ORAL | 0 refills | Status: DC

## 2020-11-11 NOTE — ED Triage Notes (Signed)
Pt sts frontal HA starting this am; pt denies hx of htn currently but did have htn during pregnancy; pt is 5 months post partum; noted hypertensive today; denies URI sx

## 2020-11-11 NOTE — ED Provider Notes (Signed)
[facility]    CSN: [id number] Arrival date & time: 11/11/20  1937      History   Chief Complaint Chief Complaint  Patient presents with   Headache    HPI Ariel Brown is a 30 y.o. female.   Patient presenting today with 1 day history of significant frontal headache that she feels like is squeezing at her temples and throbbing.  She states that she has been having sound sensitivity, light sensitivity along with the headache.  She denies recent head injury, nausea, vomiting, dizziness, syncope, history of migraine disorders.  Has not been trying anything over-the-counter for symptoms.   Past Medical History:  Diagnosis Date   Preeclampsia     There are no problems to display for this patient.   History reviewed. No pertinent surgical history.  OB History   No obstetric history on file.      Home Medications    Prior to Admission medications   Medication Sig Start Date End Date Taking? Authorizing Provider  cyclobenzaprine (FLEXERIL) 5 MG tablet Take 1 tablet (5 mg total) by mouth at bedtime as needed for muscle spasms. Do not drink alcohol or drive while taking this medication.  May cause drowsiness. 11/11/20  Yes Particia Nearing, PA-C  predniSONE (DELTASONE) 20 MG tablet Take 1 tablet (20 mg total) by mouth daily with breakfast. 11/11/20  Yes Particia Nearing, PA-C  cephALEXin (KEFLEX) 500 MG capsule Take 1 capsule (500 mg total) by mouth 3 (three) times daily. Patient not taking: Reported on 09/14/2020 06/27/17   Jaynie Crumble, PA-C  naproxen (NAPROSYN) 500 MG tablet Take 1 tablet (500 mg total) by mouth 2 (two) times daily. 06/27/17   Kirichenko, Lemont Fillers, PA-C  NIFEdipine (PROCARDIA-XL/NIFEDICAL-XL) 30 MG 24 hr tablet Take 1 tablet (30 mg total) by mouth daily. Patient not taking: Reported on 11/11/2020 07/25/20   Cuthriell, Delorise Royals, PA-C    Family History Family History  Problem Relation Age of Onset   Healthy Sister     Social  History Social History   Tobacco Use   Smoking status: Former    Types: Cigarettes   Smokeless tobacco: Current  Vaping Use   Vaping Use: Every day  Substance Use Topics   Alcohol use: Yes    Comment: social   Drug use: Not Currently     Allergies   Patient has no known allergies.   Review of Systems Review of Systems Per HPI  Physical Exam Triage Vital Signs ED Triage Vitals [11/11/20 2006]  Enc Vitals Group     BP (!) 174/93     Pulse Rate 92     Resp 18     Temp 98.9 F (37.2 C)     Temp Source Oral     SpO2 95 %     Weight      Height      Head Circumference      Peak Flow      Pain Score 8     Pain Loc      Pain Edu?      Excl. in GC?    No data found.  Updated Vital Signs BP (!) 174/93 (BP Location: Right Arm)   Pulse 92   Temp 98.9 F (37.2 C) (Oral)   Resp 18   SpO2 95%   Visual Acuity Right Eye Distance:   Left Eye Distance:   Bilateral Distance:    Right Eye Near:   Left Eye Near:  Bilateral Near:     Physical Exam Vitals and nursing note reviewed.  Constitutional:      Appearance: Normal appearance. She is not ill-appearing.  HENT:     Head: Atraumatic.     Mouth/Throat:     Mouth: Mucous membranes are moist.  Eyes:     Extraocular Movements: Extraocular movements intact.     Conjunctiva/sclera: Conjunctivae normal.  Cardiovascular:     Rate and Rhythm: Normal rate and regular rhythm.     Heart sounds: Normal heart sounds.  Pulmonary:     Effort: Pulmonary effort is normal.     Breath sounds: Normal breath sounds.  Musculoskeletal:        General: Normal range of motion.     Cervical back: Normal range of motion and neck supple.  Skin:    General: Skin is warm and dry.  Neurological:     General: No focal deficit present.     Mental Status: She is alert and oriented to person, place, and time.     Motor: No weakness.     Gait: Gait normal.  Psychiatric:        Mood and Affect: Mood normal.        Thought Content:  Thought content normal.        Judgment: Judgment normal.   UC Treatments / Results  Labs (all labs ordered are listed, but only abnormal results are displayed) Labs Reviewed - No data to display  EKG   Radiology No results found.  Procedures Procedures (including critical care time)  Medications Ordered in UC Medications - No data to display  Initial Impression / Assessment and Plan / UC Course  I have reviewed the triage vital signs and the nursing notes.  Pertinent labs & imaging results that were available during my care of the patient were reviewed by me and considered in my medical decision making (see chart for details).     Blood pressure elevated in triage, discussed this with patient and discussed lifestyle modifications, close follow-up with primary care which resources were given for this today.  Possibly a tension headache versus new migraine.  We will treat with very small burst of prednisone, Flexeril.  Discussed supportive over-the-counter remedies and home care.  Follow-up with PCP for recheck, return sooner for worsening symptoms.  Final Clinical Impressions(s) / UC Diagnoses   Final diagnoses:  Bad headache  Photophobia  Elevated blood pressure reading   Discharge Instructions   None    ED Prescriptions     Medication Sig Dispense Auth. Provider   predniSONE (DELTASONE) 20 MG tablet Take 1 tablet (20 mg total) by mouth daily with breakfast. 3 tablet Particia Nearing, PA-C   cyclobenzaprine (FLEXERIL) 5 MG tablet Take 1 tablet (5 mg total) by mouth at bedtime as needed for muscle spasms. Do not drink alcohol or drive while taking this medication.  May cause drowsiness. 5 tablet Particia Nearing, [place]      PDMP not reviewed this encounter.   Particia Nearing, [place] 11/11/20 2027

## 2021-05-27 ENCOUNTER — Emergency Department (HOSPITAL_COMMUNITY)
Admission: EM | Admit: 2021-05-27 | Discharge: 2021-05-28 | Disposition: A | Discharge status: Home / Self Care | Payer: Medicaid Other | Attending: Emergency Medicine | Admitting: Emergency Medicine

## 2021-05-27 ENCOUNTER — Encounter (HOSPITAL_COMMUNITY): Payer: Self-pay

## 2021-05-27 ENCOUNTER — Other Ambulatory Visit: Payer: Self-pay

## 2021-05-27 DIAGNOSIS — R7401 Elevation of levels of liver transaminase levels: Secondary | ICD-10-CM | POA: Diagnosis not present

## 2021-05-27 DIAGNOSIS — R1013 Epigastric pain: Secondary | ICD-10-CM | POA: Diagnosis present

## 2021-05-27 DIAGNOSIS — R112 Nausea with vomiting, unspecified: Secondary | ICD-10-CM

## 2021-05-27 DIAGNOSIS — K8020 Calculus of gallbladder without cholecystitis without obstruction: Secondary | ICD-10-CM | POA: Diagnosis not present

## 2021-05-27 MED ORDER — SODIUM CHLORIDE 0.9 % IV BOLUS
1000.0000 mL | Freq: Once | INTRAVENOUS | Status: AC
  Administered 2021-05-27: 1000 mL via INTRAVENOUS

## 2021-05-27 MED ORDER — ONDANSETRON HCL 4 MG/2ML IJ SOLN
4.0000 mg | Freq: Once | INTRAMUSCULAR | Status: AC
  Administered 2021-05-27: 4 mg via INTRAVENOUS
  Filled 2021-05-27: qty 2

## 2021-05-27 NOTE — ED Triage Notes (Addendum)
Patients abdomen started hurting around 4pm after she ate [employer]. Had to [place] over off the highway to vomit. Pain is upper abdomen. She has been vomiting but not having diarrhea.  ?

## 2021-05-27 NOTE — ED Provider Triage Note (Signed)
Emergency Medicine Provider Triage Evaluation Note ? ?Ariel Brown , a 31 y.o. female  was evaluated in triage.  Pt complains of abdominal pain since 4 pm after eating a chicken sandwich at [employer]. Pain is severe and associated with nausea and vomiting. Radiated to back. Denies chills or fever. Tearful during exam.  ? ?Review of Systems  ?Positive: As above ?Negative: As above ? ?Physical Exam  ?BP (!) 158/100 (BP Location: Left Arm)   Pulse 88   Temp 98.1 ?F (36.7 ?C) (Oral)   Resp 20   Ht 5\' 4"  (1.626 m)   Wt (!) 145.2 kg   SpO2 98%   BMI 54.93 kg/m?  ?Gen:   Awake, no distress   ?Resp:  Normal effort  ?MSK:   Moves extremities without difficulty  ?Other:  Diffuse abdominal tenderness worse in the epigastric region   ? ?Medical Decision Making  ?Medically screening exam initiated at 11:05 PM.  Appropriate orders placed.  Ariel Brown was informed that the remainder of the evaluation will be completed by another provider, this initial triage assessment does not replace that evaluation, and the importance of remaining in the ED until their evaluation is complete. ? ? ?  ?Evlyn Courier, PA-C ?05/27/21 2308 ? ?

## 2021-05-28 ENCOUNTER — Emergency Department (HOSPITAL_COMMUNITY): Payer: Medicaid Other

## 2021-05-28 LAB — COMPREHENSIVE METABOLIC PANEL
ALT: 77 U/L — ABNORMAL HIGH (ref 0–44)
AST: 209 U/L — ABNORMAL HIGH (ref 15–41)
Albumin: 4.2 g/dL (ref 3.5–5.0)
Alkaline Phosphatase: 95 U/L (ref 38–126)
Anion gap: 9 (ref 5–15)
BUN: 14 mg/dL (ref 6–20)
CO2: 25 mmol/L (ref 22–32)
Calcium: 9.3 mg/dL (ref 8.9–10.3)
Chloride: 103 mmol/L (ref 98–111)
Creatinine, Ser: 0.86 mg/dL (ref 0.44–1.00)
GFR, Estimated: >60 mL/min (ref >60)
Glucose, Bld: 100 mg/dL — ABNORMAL HIGH (ref 70–99)
Potassium: 4.1 mmol/L (ref 3.5–5.1)
Sodium: 137 mmol/L (ref 135–145)
Total Bilirubin: 1.5 mg/dL — ABNORMAL HIGH (ref 0.3–1.2)
Total Protein: 7.9 g/dL (ref 6.5–8.1)

## 2021-05-28 LAB — CBC
HCT: 38.6 % (ref 36.0–46.0)
Hemoglobin: 12.5 g/dL (ref 12.0–15.0)
MCH: 28.4 pg (ref 26.0–34.0)
MCHC: 32.4 g/dL (ref 30.0–36.0)
MCV: 87.7 fL (ref 80.0–100.0)
Platelets: 174 10*3/uL (ref 150–400)
RBC: 4.4 MIL/uL (ref 3.87–5.11)
RDW: 14.1 % (ref 11.5–15.5)
WBC: 8.1 10*3/uL (ref 4.0–10.5)
nRBC: 0 % (ref 0.0–0.2)

## 2021-05-28 LAB — HCG, QUANTITATIVE, PREGNANCY: hCG, Beta Chain, Quant, S: <1 m[IU]/mL (ref <5)

## 2021-05-28 LAB — LIPASE, BLOOD: Lipase: 27 U/L (ref 11–51)

## 2021-05-28 MED ORDER — ONDANSETRON 4 MG PO TBDP: 4.0000 mg | ORAL_TABLET | Freq: Three times a day (TID) | ORAL | 0 refills | Status: DC | PRN

## 2021-05-28 NOTE — ED Provider Notes (Signed)
?[facility] ?[facility] Emergency Department ?Provider Note ?MRN:  [id number]  ?Arrival date & time: 05/28/21    ? ?Chief Complaint   ?Abdominal Pain ?  ?History of Present Illness   ?Ariel Brown is a 31 y.o. year-old female presents to the ED with chief complaint of epigastric abdominal pain and vomiting.  She reports symptoms started this afternoon/evening after eating at [facility].  She denies any fevers or chills.  Denies any diarrhea.  States that symptoms have improved. ? ?History provided by patient. ? ? ?Review of Systems  ?Pertinent review of systems noted in HPI.  ? ? ?Physical Exam  ? ?Vitals:  ? 05/27/21 2259 05/28/21 0100  ?BP: (!) 158/100 (!) 173/101  ?Pulse: 88 79  ?Resp: 20 16  ?Temp: 98.1 ?F (36.7 ?C)   ?SpO2: 98% 94%  ?  ?CONSTITUTIONAL: Nontoxic-appearing, NAD ?NEURO:  Alert and oriented x 3, CN 3-12 grossly intact ?EYES:  eyes equal and reactive ?ENT/NECK:  Supple, no stridor  ?CARDIO: Normal rate, regular rhythm, appears well-perfused  ?PULM:  No respiratory distress, clear to auscultation ?GI/GU:  non-distended, mild epigastric abdominal tenderness, no Murphy sign or McBurney point tenderness ?MSK/SPINE:  No gross deformities, no edema, moves all extremities  ?SKIN:  no rash, atraumatic ? ? ?*Additional and/or pertinent findings included in MDM below ? ?Diagnostic and Interventional Summary  ? ? ?Labs Reviewed  ?COMPREHENSIVE METABOLIC PANEL - Abnormal; Notable for the following components:  ?    Result Value  ? Glucose, Bld 100 (*)   ? AST 209 (*)   ? ALT 77 (*)   ? Total Bilirubin 1.5 (*)   ? All other components within normal limits  ?LIPASE, BLOOD  ?CBC  ?HCG, QUANTITATIVE, PREGNANCY  ?URINALYSIS, ROUTINE W REFLEX MICROSCOPIC  ?  ?US Abdomen Limited  ?Final Result  ?  ?  ?Medications  ?sodium chloride 0.9 % bolus 1,000 mL (1,000 mLs Intravenous New Bag/Given 05/27/21 2335)  ?ondansetron [facility]) injection 4 mg (4 mg Intravenous Given 05/27/21 2336)  ?  ? ?Procedures  /   Critical Care ?Procedures ? ?ED Course and Medical Decision Making  ?I have reviewed the triage vital signs, the nursing notes, and pertinent available records from the EMR. ? ?Complexity of Problems Addressed: ?High Complexity: Acute illness/injury posing a threat to life or bodily function, requiring emergent diagnostic workup, evaluation, and treatment as below. ?Comorbidities affecting this illness/injury include: ?None ?Social Determinants Affecting Care: ? No clinically significant social determinants affecting this chief complaint.. ? ? ?ED Course: ?After considering the following differential, food poisoning, gastroenteritis, pancreatitis, cholecystitis, I ordered labs and right upper quadrant ultrasound. ?I personally interpreted the labs which are notable for mildly elevated AST and ALT, no leukocytosis ?I visualized the [place] which is notable for cholelithiasis without cholecystitis and agree with radiologist interpretation.  ? ?  ? ?Consultants: ?No consultations were needed in caring for this patient. ? ?Treatment and Plan: ?Patient treated with Zofran and fluids in the ED.  She is feeling improved.  Ultrasound shows cholelithiasis, but without cholecystitis.  Given that pain is controlled and patient is feeling better, feel that she can be discharged home with outpatient follow-up. ? ?Emergency department workup does not suggest an emergent condition requiring admission or immediate intervention beyond  what has been performed at this time. The patient is safe for discharge and has  been instructed to return immediately for worsening symptoms, change in  symptoms or any other concerns ? ?Patient discussed with attending physician, Dr.  Pollina, who agrees with plan for discharge. ? ?Final Clinical Impressions(s) / ED Diagnoses  ? ?  ICD-10-CM   ?1. Nausea and vomiting, unspecified vomiting type  R11.2   ?  ?  ?ED Discharge Orders   ? ?      Ordered  ?  ondansetron (ZOFRAN-ODT) 4 MG disintegrating tablet   Every 8 hours PRN       ? 05/28/21 0151  ? ?  ?  ? ?  ?  ? ? ?Discharge Instructions Discussed with and Provided to Patient:  ? ? ?Discharge Instructions   ? ?  ?The ultrasound showed gallstones in your gallbladder.  This could be a potential cause of your pain and vomiting.  You need to follow-up with general surgery as listed on this document.  Please take nausea medicine as prescribed.  You should avoid greasy and fatty foods.  Avoid alcohol. ? ? ? ?  ?Roxy Horseman, PA-C ?05/28/21 0205 ? ?  ?Gilda Crease, MD ?05/28/21 0301 ? ?

## 2021-05-28 NOTE — Discharge Instructions (Signed)
The ultrasound showed gallstones in your gallbladder.  This could be a potential cause of your pain and vomiting.  You need to follow-up with general surgery as listed on this document.  Please take nausea medicine as prescribed.  You should avoid greasy and fatty foods.  Avoid alcohol. ?

## 2022-07-01 ENCOUNTER — Emergency Department (HOSPITAL_COMMUNITY)
Admission: EM | Admit: 2022-07-01 | Discharge: 2022-07-01 | Disposition: A | Discharge status: Home / Self Care | Payer: Medicaid Other | Attending: Emergency Medicine | Admitting: Emergency Medicine

## 2022-07-01 ENCOUNTER — Other Ambulatory Visit: Payer: Self-pay

## 2022-07-01 DIAGNOSIS — J029 Acute pharyngitis, unspecified: Secondary | ICD-10-CM | POA: Diagnosis present

## 2022-07-01 DIAGNOSIS — Z79899 Other long term (current) drug therapy: Secondary | ICD-10-CM | POA: Insufficient documentation

## 2022-07-01 DIAGNOSIS — I10 Essential (primary) hypertension: Secondary | ICD-10-CM | POA: Diagnosis not present

## 2022-07-01 DIAGNOSIS — J358 Other chronic diseases of tonsils and adenoids: Secondary | ICD-10-CM | POA: Insufficient documentation

## 2022-07-01 DIAGNOSIS — J069 Acute upper respiratory infection, unspecified: Secondary | ICD-10-CM | POA: Diagnosis not present

## 2022-07-01 LAB — GROUP A STREP BY PCR: Group A Strep by PCR: NOT DETECTED

## 2022-07-01 MED ORDER — NIFEDIPINE ER OSMOTIC RELEASE 30 MG PO TB24: 30.0000 mg | ORAL_TABLET | Freq: Every day | ORAL | 0 refills | Status: AC

## 2022-07-01 MED ORDER — BENZONATATE 200 MG PO CAPS: 200.0000 mg | ORAL_CAPSULE | Freq: Three times a day (TID) | ORAL | 0 refills | Status: AC

## 2022-07-01 MED ORDER — DEXAMETHASONE 4 MG PO TABS
6.0000 mg | ORAL_TABLET | Freq: Once | ORAL | Status: AC
  Administered 2022-07-01: 6 mg via ORAL
  Filled 2022-07-01: qty 1

## 2022-07-01 NOTE — ED Triage Notes (Signed)
Pt having sore throat x 3 days. Daughter had strep 2 weeks ago. Soreness unrelieved with OTC meds. Reports fever and chills

## 2022-07-01 NOTE — Discharge Instructions (Signed)
Viral respiratory infection. Recommend Zyrtec, Flonase, Coricidin HBP as directed. Tessalon as needed as prescribed for cough.  Your blood pressure is elevated today, review of records shows consistently high blood pressure readings. Cold medications like Sudafed can cause your blood pressure to be high. Recommend stopping the Sudafed. You can take Coricidin HBP instead for symptom relief. Monitor your blood pressure, if it remains higher than 120/80, take the Procardia prescribed and follow up with a primary care doctor.   You appear to have a stone in your right tonsil. If you continue to have pain in your tonsils after your cold resolves, follow up with ENT, call to schedule an appointment.

## 2022-07-01 NOTE — ED Provider Notes (Signed)
[place] EMERGENCY DEPARTMENT AT [facility] Provider Note   CSN: [id number] Arrival date & time: 07/01/22  2159     History  Chief Complaint  Patient presents with   Sore Throat    Ariel Brown is a 32 y.o. female.  32yo female with complaint of sore throat x 2 days, more so on the left side, worse with swallowing today which prompted ER visit. Took Sudafed without relief. Also cough, congestion, fevers (max 102 oral last night).  Daughter had strep 2 weeks ago.  Hx of HTN, not on meds (not taking, was not aware there was an rx on file).        Home Medications Prior to Admission medications   Medication Sig Start Date End Date Taking? Authorizing Provider  benzonatate (TESSALON) 200 MG capsule Take 1 capsule (200 mg total) by mouth every 8 (eight) hours for 10 days. 07/01/22 07/11/22 Yes Jeannie Fend, PA-C  NIFEdipine (PROCARDIA-XL/NIFEDICAL-XL) 30 MG 24 hr tablet Take 1 tablet (30 mg total) by mouth daily. 07/01/22   Jeannie Fend, PA-C      Allergies    Meloxicam    Review of Systems   Review of Systems Negative except as per HPI.  Physical Exam Updated Vital Signs BP (!) 171/119   Pulse 81   Temp 98.6 F (37 C) (Oral)   Resp 19   LMP 06/18/2022   SpO2 100%  Physical Exam Vitals and nursing note reviewed.  Constitutional:      General: She is not in acute distress.    Appearance: She is well-developed. She is not diaphoretic.  HENT:     Head: Normocephalic and atraumatic.     Nose: Congestion present.     Mouth/Throat:     Mouth: Mucous membranes are moist.     Pharynx: Oropharynx is clear. Uvula midline. Posterior oropharyngeal erythema present. No pharyngeal swelling, oropharyngeal exudate or uvula swelling.     Tonsils: No tonsillar exudate or tonsillar abscesses. 1+ on the right. 1+ on the left.  Cardiovascular:     Rate and Rhythm: Normal rate and regular rhythm.     Heart sounds: Normal heart sounds.  Pulmonary:     Effort:  Pulmonary effort is normal.     Breath sounds: Normal breath sounds.  Musculoskeletal:     Cervical back: Neck supple.  Lymphadenopathy:     Cervical: No cervical adenopathy.  Skin:    General: Skin is warm and dry.     Findings: No erythema or rash.  Neurological:     Mental Status: She is alert and oriented to person, place, and time.  Psychiatric:        Behavior: Behavior normal.     ED Results / Procedures / Treatments   Labs (all labs ordered are listed, but only abnormal results are displayed) Labs Reviewed  GROUP A STREP BY PCR    EKG None  Radiology No results found.  Procedures Procedures    Medications Ordered in ED Medications - No data to display  ED Course/ Medical Decision Making/ A&P                             Medical Decision Making Risk Prescription drug management.   32 year old female presents with complaint of, congestion, fever and sore throat as above.  Concerned she may have strep after exposure to her daughter who had strep 3 weeks ago.  On exam,  appears to have postnasal drip, nasal congestion, tonsil stone.  No tender cervical lymphadenopathy.  Strep test is negative.  Suspect viral URI.  Patient's blood pressure is elevated today, historically elevated on chart review, reports taking Sudafed today.  Advised to discontinue the Sudafed, discussed her elevated blood pressure readings.  Recommend she monitor her blood pressure at home.  If her blood pressure remains elevated, take Procardia as previously prescribed and follow-up with primary care provider.  Regarding her tonsil stone, if she continues to have pain in her tonsils after her cold resolves, provided with referral to ENT.        Final Clinical Impression(s) / ED Diagnoses Final diagnoses:  Viral upper respiratory tract infection  Hypertension, unspecified type  Tonsil stone    Rx / DC Orders ED Discharge Orders          Ordered    benzonatate (TESSALON) 200 MG  capsule  Every 8 hours        07/01/22 2256    NIFEdipine (PROCARDIA-XL/NIFEDICAL-XL) 30 MG 24 hr tablet  Daily        07/01/22 2309              Jeannie Fend, PA-C 07/01/22 2315    Dione Booze, MD 07/02/22 [phone]

## 2022-08-11 IMAGING — US US ABDOMEN LIMITED
1 series · 15 of 25 positions shown · non-contrast
Comparison: None.

CLINICAL DATA: Right upper quadrant pain.

EXAM:
ULTRASOUND ABDOMEN LIMITED RIGHT UPPER QUADRANT

[Series 1: us abdomen limited mc & wl · 15 of 37 slices shown]
[im 1/37]
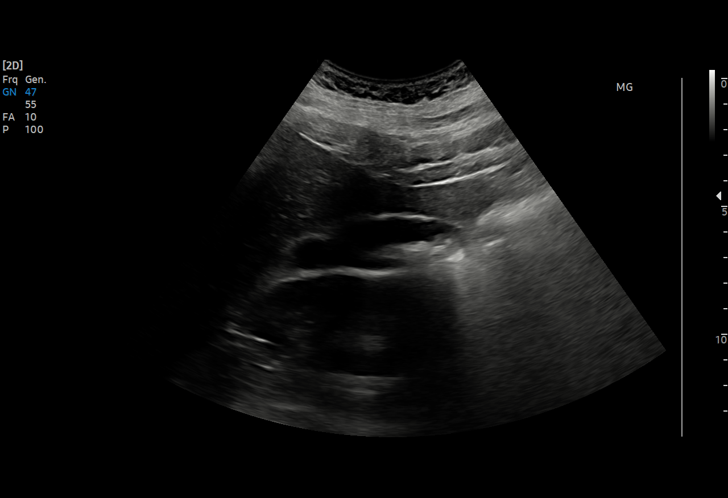
[im 4/37]
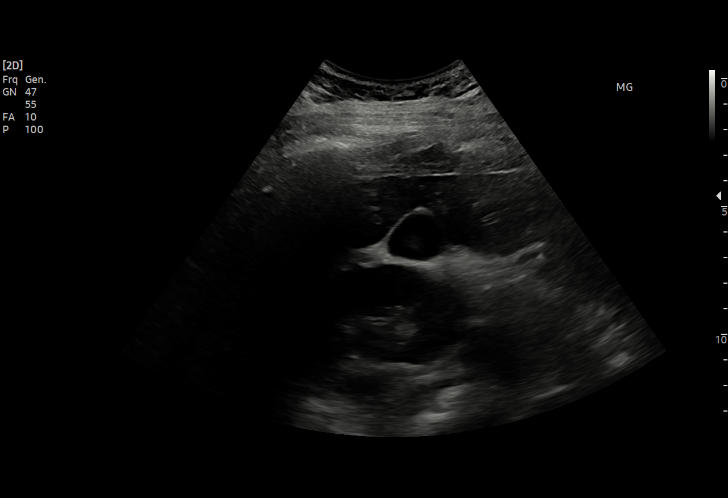
[im 7/37]
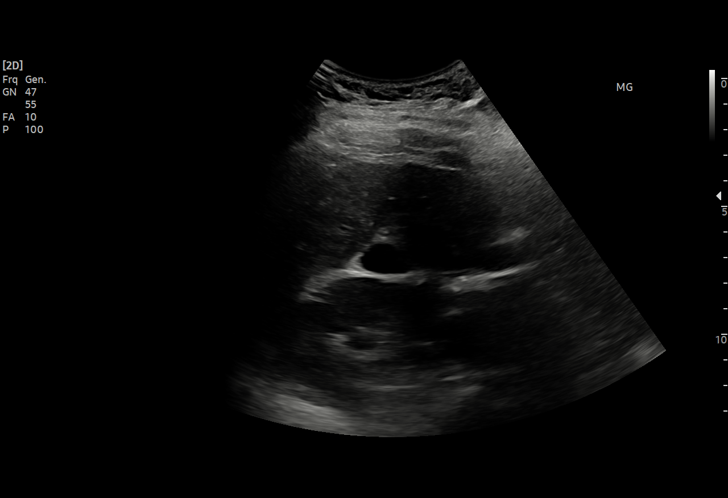
[im 8/37]
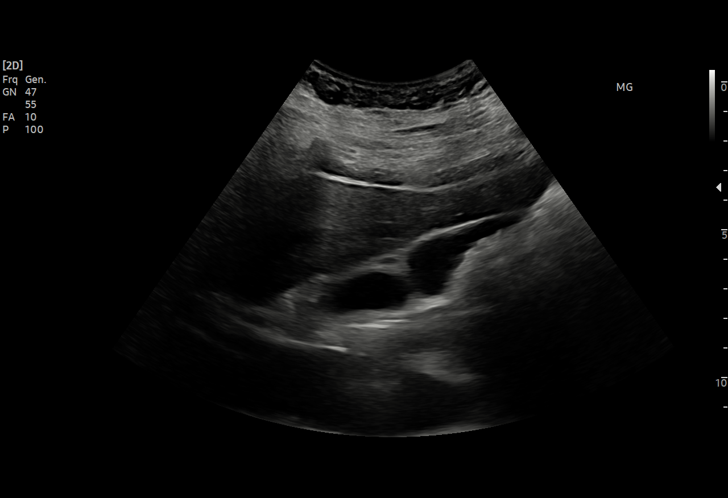
[im 11/37]
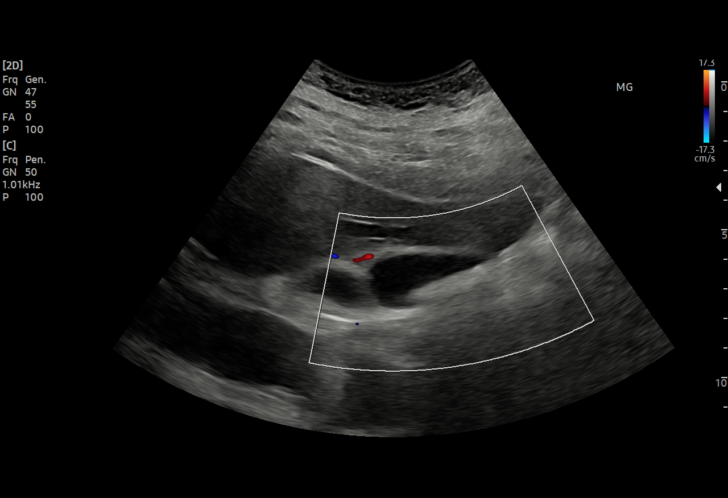
[im 14/37]
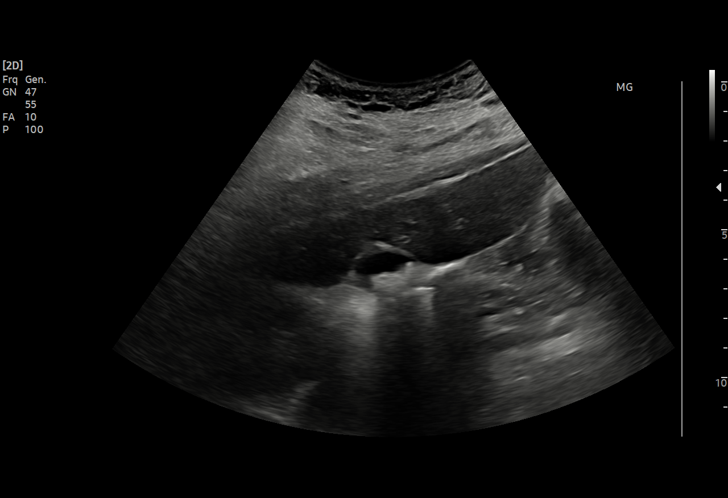
[im 16/37]
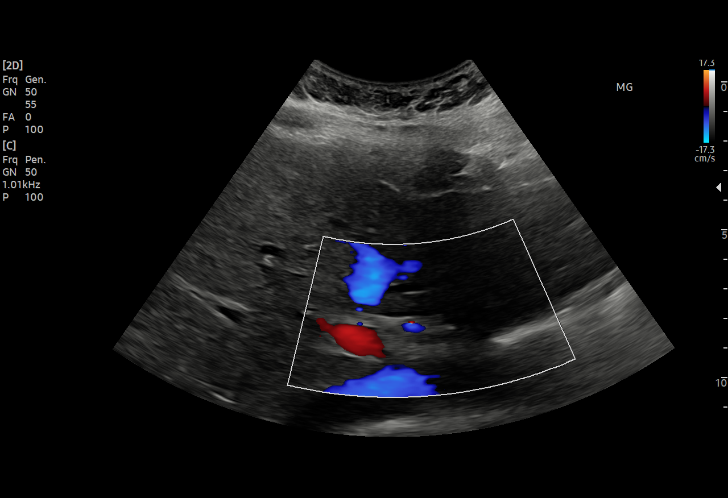
[im 19/37]
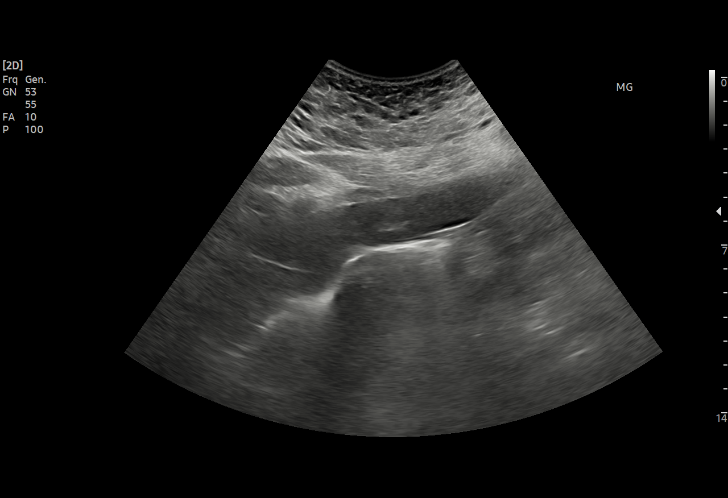
[im 22/37]
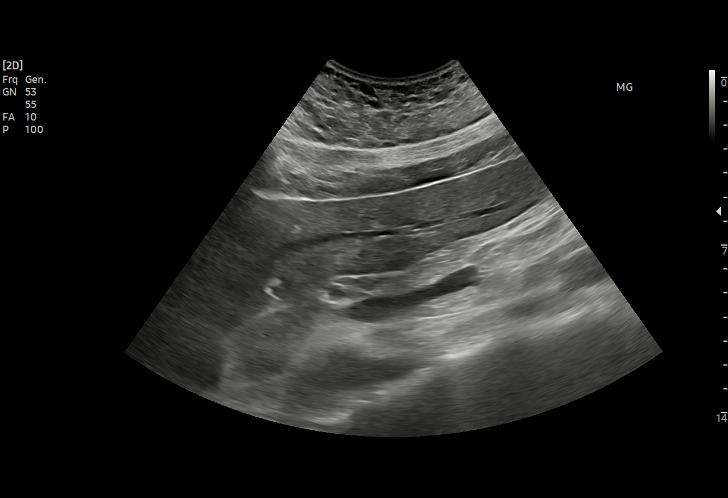
[im 23/37]
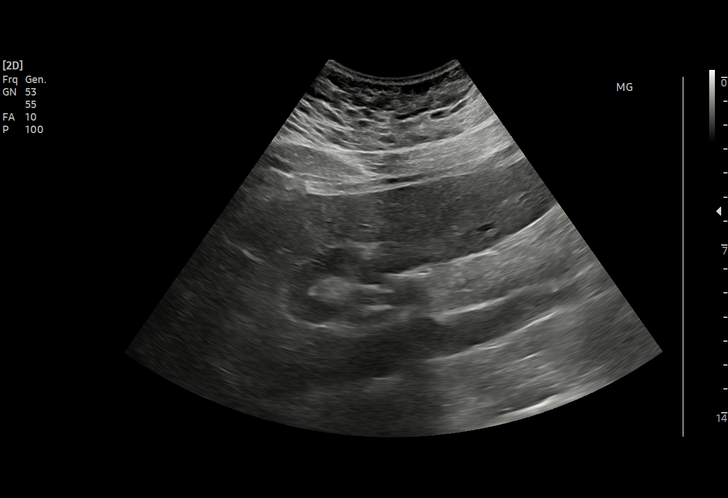
[im 26/37]
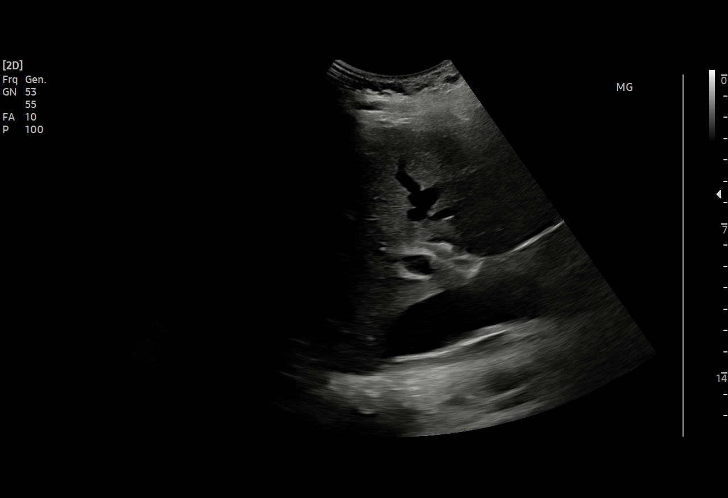
[im 29/37]
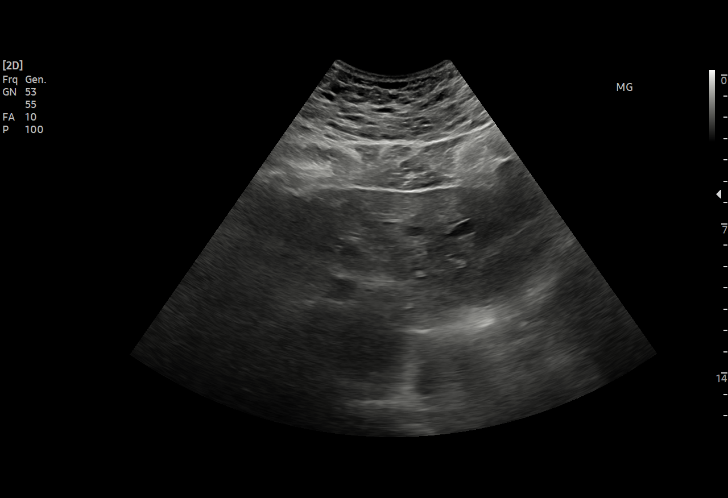
[im 31/37]
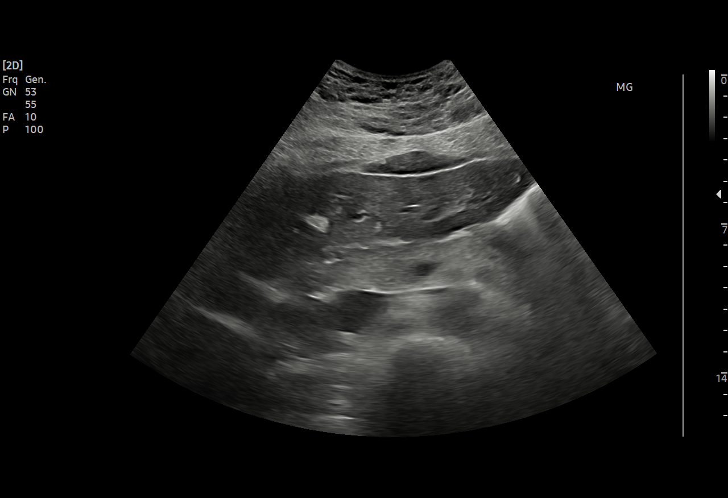
[im 34/37]
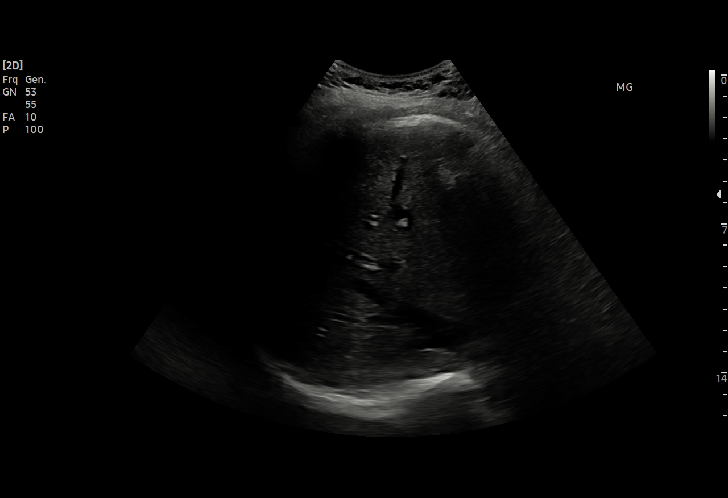
[im 37/37]
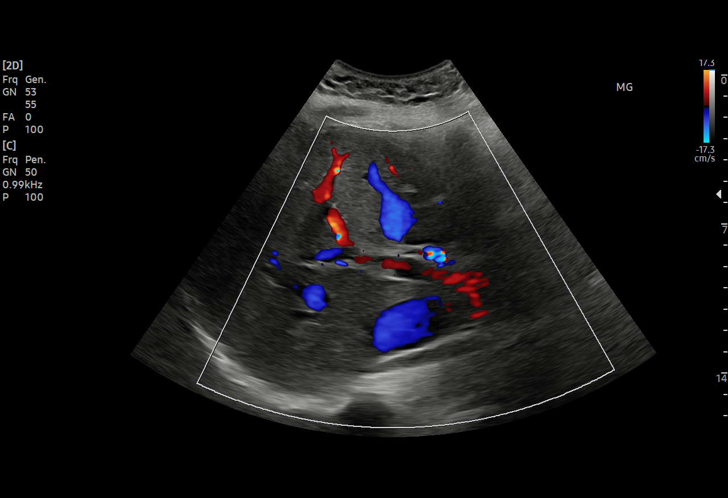

[15 of 25 positions shown; findings below may reference images not displayed]

FINDINGS: Gallbladder:

Numerous small shadowing, echogenic gallstones are seen within the
gallbladder lumen. The gallbladder wall measures 2.8 mm in
thickness. No sonographic Murphy sign noted by sonographer.

Common bile duct:

Diameter: 3.5 mm

Liver:

No focal lesion identified. Within normal limits in parenchymal
echogenicity. Portal vein is patent on color Doppler imaging with
normal direction of blood flow towards the liver.

Other: None.
IMPRESSION: Cholelithiasis without evidence of acute cholecystitis.

## 2022-08-20 ENCOUNTER — Other Ambulatory Visit: Payer: Self-pay

## 2022-08-20 ENCOUNTER — Encounter (HOSPITAL_COMMUNITY): Payer: Self-pay

## 2022-08-20 ENCOUNTER — Emergency Department (HOSPITAL_COMMUNITY)
Admission: EM | Admit: 2022-08-20 | Discharge: 2022-08-20 | Disposition: A | Discharge status: Home / Self Care | Payer: Medicaid Other | Attending: Emergency Medicine | Admitting: Emergency Medicine

## 2022-08-20 DIAGNOSIS — S40862A Insect bite (nonvenomous) of left upper arm, initial encounter: Secondary | ICD-10-CM | POA: Insufficient documentation

## 2022-08-20 DIAGNOSIS — W57XXXA Bitten or stung by nonvenomous insect and other nonvenomous arthropods, initial encounter: Secondary | ICD-10-CM | POA: Diagnosis not present

## 2022-08-20 DIAGNOSIS — Y990 Civilian activity done for income or pay: Secondary | ICD-10-CM | POA: Insufficient documentation

## 2022-08-20 DIAGNOSIS — R030 Elevated blood-pressure reading, without diagnosis of hypertension: Secondary | ICD-10-CM | POA: Insufficient documentation

## 2022-08-20 MED ORDER — DIPHENHYDRAMINE HCL 25 MG PO TABS: 25.0000 mg | ORAL_TABLET | Freq: Four times a day (QID) | ORAL | 0 refills | Status: AC | PRN

## 2022-08-20 MED ORDER — HYDROCORTISONE 1 % EX CREA: TOPICAL_CREAM | CUTANEOUS | 0 refills | Status: AC

## 2022-08-20 MED ORDER — FAMOTIDINE 20 MG PO TABS: 20.0000 mg | ORAL_TABLET | Freq: Every day | ORAL | 0 refills | Status: AC

## 2022-08-20 MED ORDER — DIPHENHYDRAMINE HCL 25 MG PO CAPS
25.0000 mg | ORAL_CAPSULE | Freq: Once | ORAL | Status: AC
  Administered 2022-08-20: 25 mg via ORAL
  Filled 2022-08-20: qty 1

## 2022-08-20 MED ORDER — FAMOTIDINE 20 MG PO TABS
20.0000 mg | ORAL_TABLET | Freq: Once | ORAL | Status: AC
  Administered 2022-08-20: 20 mg via ORAL
  Filled 2022-08-20: qty 1

## 2022-08-20 NOTE — Discharge Instructions (Addendum)
Apply the steroid cream as prescribed.  Use Benadryl as needed for itching.  Do not take it if you are working or driving as it may make you sleepy.  Your blood pressure today is elevated and you should follow-up with your primary doctor regarding blood pressure and need for possible medication.  Keep a record of your blood pressure at home and follow-up with your doctor to determine if you need to be on blood pressure medication.  Return to the ED with new or worsening symptoms

## 2022-08-20 NOTE — ED Provider Notes (Signed)
[place] EMERGENCY DEPARTMENT AT [facility] Provider Note   CSN: [id number] Arrival date & time: 08/20/22  0016     History  Chief Complaint  Patient presents with   Insect Bite    Jezabel Shaheed is a 32 y.o. female.  Patient reports she was bitten or stung by something about 4 PM while she was at work.  Did not see what bit or stung her.  Has an itchy bump to her left upper arm.  She has been putting Neosporin on it without relief.  Denies any difficulty breathing or difficulty swallowing.  No chest pain or shortness of breath.  No tongue swelling or lip swelling.  No fevers, chills, nausea or vomiting.  The history is provided by the patient.       Home Medications Prior to Admission medications   Medication Sig Start Date End Date Taking? Authorizing Provider  NIFEdipine (PROCARDIA-XL/NIFEDICAL-XL) 30 MG 24 hr tablet Take 1 tablet (30 mg total) by mouth daily. 07/01/22   Jeannie Fend, PA-C      Allergies    Meloxicam    Review of Systems   Review of Systems  Constitutional:  Negative for activity change, appetite change and fever.  HENT:  Negative for congestion and rhinorrhea.   Respiratory:  Negative for cough, chest tightness and shortness of breath.   Cardiovascular:  Negative for chest pain.  Gastrointestinal:  Negative for abdominal pain, nausea and vomiting.  Genitourinary:  Negative for dysuria and hematuria.  Musculoskeletal:  Negative for arthralgias and myalgias.  Skin:  Positive for rash.  Neurological:  Negative for weakness.   all other systems are negative except as noted in the HPI and PMH.    Physical Exam Updated Vital Signs BP (!) 155/104   Pulse 89   Temp 99.3 F (37.4 C) (Oral)   Resp 18   SpO2 100%  Physical Exam Vitals and nursing note reviewed.  Constitutional:      General: She is not in acute distress.    Appearance: She is well-developed.  HENT:     Head: Normocephalic and atraumatic.     Mouth/Throat:      Pharynx: No oropharyngeal exudate.  Eyes:     Conjunctiva/sclera: Conjunctivae normal.     Pupils: Pupils are equal, round, and reactive to light.  Neck:     Comments: No meningismus. Cardiovascular:     Rate and Rhythm: Normal rate and regular rhythm.     Heart sounds: Normal heart sounds. No murmur heard. Pulmonary:     Effort: Pulmonary effort is normal. No respiratory distress.     Breath sounds: Normal breath sounds.  Abdominal:     Palpations: Abdomen is soft.     Tenderness: There is no abdominal tenderness. There is no guarding or rebound.  Musculoskeletal:        General: Tenderness present. Normal range of motion.     Cervical back: Normal range of motion and neck supple.     Comments: Left upper arm 2 cm erythematous bug bite with surrounding induration.  No fluctuance  Skin:    General: Skin is warm.     Findings: Rash present.  Neurological:     Mental Status: She is alert and oriented to person, place, and time.     Cranial Nerves: No cranial nerve deficit.     Motor: No abnormal muscle tone.     Coordination: Coordination normal.     Comments: No ataxia on finger to  nose bilaterally. No pronator drift. 5/5 strength throughout. CN 2-12 intact.Equal grip strength. Sensation intact.   Psychiatric:        Behavior: Behavior normal.     ED Results / Procedures / Treatments   Labs (all labs ordered are listed, but only abnormal results are displayed) Labs Reviewed - No data to display  EKG None  Radiology No results found.  Procedures Procedures    Medications Ordered in ED Medications  famotidine (PEPCID) tablet 20 mg (has no administration in time range)  diphenhydrAMINE (BENADRYL) capsule 25 mg (has no administration in time range)    ED Course/ Medical Decision Making/ A&P                             Medical Decision Making Amount and/or Complexity of Data Reviewed Labs: ordered. Decision-making details documented in ED Course. Radiology:  ordered and independent interpretation performed. Decision-making details documented in ED Course. ECG/medicine tests: ordered and independent interpretation performed. Decision-making details documented in ED Course.  Likely localized bug bite with surrounding inflammation.  No evidence of abscess.  No fluctuance or drainage.  No fever, chills, nausea or vomiting.  No tongue swelling or lip swelling.  No chest pain or shortness of breath. No evidence of anaphylaxis.  Will treat with antihistamines and topical steroids. Blood pressure today is elevated.  Follow-up with PCP for recheck.  Return to ED with new or worsening symptoms.       Final Clinical Impression(s) / ED Diagnoses Final diagnoses:  None    Rx / DC Orders ED Discharge Orders     None         Dearia Wilmouth, Jeannett Senior, MD 08/20/22 [phone]

## 2022-08-20 NOTE — ED Triage Notes (Signed)
Pt. Arrives POV C/O an insect bite. Pt. States that she noticed it at around 4pm. She states that it is itchy and tingling. She has been putting antibiotic ointment on it.

## 2023-03-08 ENCOUNTER — Other Ambulatory Visit: Payer: Self-pay

## 2023-03-08 ENCOUNTER — Emergency Department (HOSPITAL_COMMUNITY)
Admission: EM | Admit: 2023-03-08 | Discharge: 2023-03-08 | Disposition: A | Discharge status: Home / Self Care | Payer: Self-pay | Attending: Emergency Medicine | Admitting: Emergency Medicine

## 2023-03-08 ENCOUNTER — Encounter (HOSPITAL_COMMUNITY): Payer: Self-pay

## 2023-03-08 DIAGNOSIS — J101 Influenza due to other identified influenza virus with other respiratory manifestations: Secondary | ICD-10-CM | POA: Insufficient documentation

## 2023-03-08 DIAGNOSIS — Z20822 Contact with and (suspected) exposure to covid-19: Secondary | ICD-10-CM | POA: Insufficient documentation

## 2023-03-08 LAB — RESP PANEL BY RT-PCR (RSV, FLU A&B, COVID)  RVPGX2
Influenza A by PCR: POSITIVE — AB
Influenza B by PCR: NEGATIVE
Resp Syncytial Virus by PCR: NEGATIVE
SARS Coronavirus 2 by RT PCR: NEGATIVE

## 2023-03-08 LAB — GROUP A STREP BY PCR: Group A Strep by PCR: NOT DETECTED

## 2023-03-08 MED ORDER — OSELTAMIVIR PHOSPHATE 75 MG PO CAPS: 75.0000 mg | ORAL_CAPSULE | Freq: Two times a day (BID) | ORAL | 0 refills | Status: AC

## 2023-03-08 NOTE — ED Provider Notes (Signed)
 [place] EMERGENCY DEPARTMENT AT [facility] Provider Note   CSN: [id number] Arrival date & time: 03/08/23  1745     History  Chief Complaint  Patient presents with   Sore Throat    Ariel Brown is a 33 y.o. female.   Sore Throat  Patient states ED today complaining of a 12-hour history of sore throat, congestion, cough, headache.  Denies any recent sick contacts.  Endorses mild generalized abdominal pain.  Denies fever, vision changes, neck stiffness, nausea, vomiting, diarrhea, constipation, dysuria, hematuria, melena/hematochezia.   Has not taken any medic today.    Home Medications Prior to Admission medications   Medication Sig Start Date End Date Taking? Authorizing Provider  oseltamivir  (TAMIFLU ) 75 MG capsule Take 1 capsule (75 mg total) by mouth every 12 (twelve) hours. 03/08/23  Yes Ahman Dugdale S, PA-C  diphenhydrAMINE  (BENADRYL ) 25 MG tablet Take 1 tablet (25 mg total) by mouth every 6 (six) hours as needed. 08/20/22   Rancour, Garnette, MD  famotidine  (PEPCID ) 20 MG tablet Take 1 tablet (20 mg total) by mouth daily. 08/20/22   Rancour, Garnette, MD  hydrocortisone  cream 1 % Apply to affected area 2 times daily 08/20/22   Rancour, Garnette, MD  NIFEdipine  (PROCARDIA -XL/NIFEDICAL-XL) 30 MG 24 hr tablet Take 1 tablet (30 mg total) by mouth daily. 07/01/22   Beverley Leita LABOR, PA-C      Allergies    Meloxicam    Review of Systems   Review of Systems  HENT:  Positive for congestion and sore throat.   Respiratory:  Positive for cough.   All other systems reviewed and are negative.   Physical Exam Updated Vital Signs BP (!) 154/117   Pulse 88   Temp 98 F (36.7 C)   Resp 18   Ht 5' 4 (1.626 m)   Wt (!) 140.6 kg   SpO2 100%   BMI 53.21 kg/m  Physical Exam Vitals and nursing note reviewed.  Constitutional:      General: She is not in acute distress.    Appearance: Normal appearance. She is ill-appearing. She is not toxic-appearing.  HENT:      Head: Normocephalic and atraumatic.     Nose: Congestion and rhinorrhea present.     Mouth/Throat:     Mouth: Mucous membranes are moist. No oral lesions.     Pharynx: Oropharynx is clear. No pharyngeal swelling, oropharyngeal exudate, posterior oropharyngeal erythema or uvula swelling.     Tonsils: No tonsillar exudate.  Eyes:     Extraocular Movements: Extraocular movements intact.     Conjunctiva/sclera: Conjunctivae normal.  Cardiovascular:     Rate and Rhythm: Normal rate and regular rhythm.     Pulses: Normal pulses.     Heart sounds: Normal heart sounds. No murmur heard.    No friction rub. No gallop.  Pulmonary:     Effort: Pulmonary effort is normal. No respiratory distress.     Breath sounds: Normal breath sounds.  Abdominal:     General: Abdomen is flat.     Palpations: Abdomen is soft.     Tenderness: There is abdominal tenderness (Generalized abdominal tenderness noted).  Skin:    General: Skin is warm and dry.     Coloration: Skin is not pale.     Findings: No erythema.  Neurological:     General: No focal deficit present.     Mental Status: She is alert. Mental status is at baseline.  Psychiatric:  Mood and Affect: Mood normal.     ED Results / Procedures / Treatments   Labs (all labs ordered are listed, but only abnormal results are displayed) Labs Reviewed  RESP PANEL BY RT-PCR (RSV, FLU A&B, COVID)  RVPGX2 - Abnormal; Notable for the following components:      Result Value   Influenza A by PCR POSITIVE (*)    All other components within normal limits  GROUP A STREP BY PCR    EKG None  Radiology No results found.  Procedures Procedures   Medications Ordered in ED Medications - No data to display  ED Course/ Medical Decision Making/ A&P                               Medical Decision Making  This patient is a 33 year old female who presents to the ED for concern of sore throat, cough, congestion for the last 12 hours.    Differential diagnoses prior to evaluation: The emergent differential diagnosis includes, but is not limited to, URI, pneumonia, strep, gastritis, Ludwig's,abscess. This is not an exhaustive differential.   Past Medical History / Co-morbidities / Social History: Current smoker.  Additional history: Chart reviewed. Pertinent results include: Showing no recent pertinent history.  Lab Tests/Imaging studies: I personally interpreted labs/imaging and the pertinent results include:   Respiratory panel was positive for influenza A but otherwise unremarkable Strep test negative    Medications: No medications necessary at this time. I have reviewed the patients home medicines and have made adjustments as needed.    ED Course:  Patient is a 33 year old female presents the ED today complaining of a 12-hour history of congestion, cough, abdominal pain.  Denies sick contacts.  No concerning signs for meningitis, pneumonia, sepsis.  Patient vital signs are stable.  Respiratory panel was done showing positive for influenza A.  Due to her being within the 48-hour window, she wished to undergo Tamiflu  treatment.  Tamiflu  was then prescribed.  Patient was told of symptomatic treatment to do at home for current symptoms.  Patient was also alerted to need for isolation until fever free for 24 hours and that symptoms may get worse due to her not currently having fever yet.  Patient expressed agreement and understanding with plan.  Patient provided strict return to ER precautions.   Disposition: After consideration of the diagnostic results and the patients response to treatment, I feel that patient benefit from discharge and treatment noted as above.   emergency department workup does not suggest an emergent condition requiring admission or immediate intervention beyond what has been performed at this time. The plan is: Tamiflu , symptomatic treatment, follow-up with PCP or urgent care if symptoms persist  longer than a week, return for new or worsening symptoms. The patient is safe for discharge and has been instructed to return immediately for worsening symptoms, change in symptoms or any other concerns.   Final Clinical Impression(s) / ED Diagnoses Final diagnoses:  Influenza A    Rx / DC Orders ED Discharge Orders          Ordered    oseltamivir  (TAMIFLU ) 75 MG capsule  Every 12 hours        03/08/23 2102              Zaniel Marineau S, PA-C 03/08/23 2250    Jerral Meth, MD 03/09/23 1459

## 2023-03-08 NOTE — Discharge Instructions (Addendum)
 You are seen today for the flu.  This was confirmed by testing.  This is a viral illness that will get better on its own.  However taking Tamiflu  may reduce your symptoms by a day or 2 if you take it within the first 48 hours of symptoms.  I am prescribing Tamiflu  which she can pick up at the local pharmacy.  Need to rest, hydrate, take Tylenol, take Mucinex for pain relief and continue help with congestion.  Take Tylenol (acetominophen)  650mg  every 4-6 hours, as needed for pain or fever. Do not take more than 4,000 mg in a 24-hour period. As this may cause liver damage. While this is rare, if you begin to develop yellowing of the skin or eyes, stop taking and return to ER immediately.  You need to isolate until fever free for 24 hours.  Symptoms may get worse over the course of the next couple days as you have little symptoms currently.  If symptoms continue to persist follow-up with your PCP or urgent care for reevaluation.  If becoming short of breath, chest pain, worsening cough return to ER for further evaluation.  It was a pleasure seeing you in the ER.

## 2023-03-08 NOTE — ED Triage Notes (Signed)
 Sore throat and body aches that started this AM.

## 2023-05-27 ENCOUNTER — Emergency Department (HOSPITAL_BASED_OUTPATIENT_CLINIC_OR_DEPARTMENT_OTHER)
Admission: EM | Admit: 2023-05-27 | Discharge: 2023-05-27 | Discharge status: Against Medical Advice | Payer: Self-pay | Attending: Emergency Medicine | Admitting: Emergency Medicine

## 2023-05-27 ENCOUNTER — Other Ambulatory Visit: Payer: Self-pay

## 2023-05-27 ENCOUNTER — Encounter (HOSPITAL_BASED_OUTPATIENT_CLINIC_OR_DEPARTMENT_OTHER): Payer: Self-pay | Admitting: Emergency Medicine

## 2023-05-27 DIAGNOSIS — R109 Unspecified abdominal pain: Secondary | ICD-10-CM | POA: Insufficient documentation

## 2023-05-27 DIAGNOSIS — Z5321 Procedure and treatment not carried out due to patient leaving prior to being seen by health care provider: Secondary | ICD-10-CM | POA: Insufficient documentation

## 2023-05-27 DIAGNOSIS — R112 Nausea with vomiting, unspecified: Secondary | ICD-10-CM | POA: Insufficient documentation

## 2023-05-27 NOTE — ED Triage Notes (Signed)
 Mid abd pain x 3 days . Nausea and emesis . Hx gallstones . Denies urinary symptoms , no diarrhea .

## 2023-11-07 ENCOUNTER — Encounter (HOSPITAL_BASED_OUTPATIENT_CLINIC_OR_DEPARTMENT_OTHER): Payer: Self-pay

## 2023-11-07 ENCOUNTER — Emergency Department (HOSPITAL_BASED_OUTPATIENT_CLINIC_OR_DEPARTMENT_OTHER)
Admission: EM | Admit: 2023-11-07 | Discharge: 2023-11-07 | Disposition: A | Discharge status: Home / Self Care | Payer: Self-pay | Attending: Emergency Medicine | Admitting: Emergency Medicine

## 2023-11-07 ENCOUNTER — Other Ambulatory Visit: Payer: Self-pay

## 2023-11-07 DIAGNOSIS — J029 Acute pharyngitis, unspecified: Secondary | ICD-10-CM | POA: Insufficient documentation

## 2023-11-07 LAB — RESP PANEL BY RT-PCR (RSV, FLU A&B, COVID)  RVPGX2
Influenza A by PCR: NEGATIVE
Influenza B by PCR: NEGATIVE
Resp Syncytial Virus by PCR: NEGATIVE
SARS Coronavirus 2 by RT PCR: NEGATIVE

## 2023-11-07 LAB — GROUP A STREP BY PCR: Group A Strep by PCR: NOT DETECTED

## 2023-11-07 MED ORDER — DEXAMETHASONE 4 MG PO TABS
10.0000 mg | ORAL_TABLET | Freq: Once | ORAL | Status: AC
  Administered 2023-11-07: 10 mg via ORAL
  Filled 2023-11-07: qty 3

## 2023-11-07 NOTE — ED Notes (Signed)
 Pt alert and oriented X 4 at the time of discharge. RR even and unlabored. No acute distress noted. Pt verbalized understanding of discharge instructions as discussed. Pt ambulatory to lobby at time of discharge.

## 2023-11-07 NOTE — ED Triage Notes (Signed)
 Pt presents with complaints of congestion, sore throat and non productive cough that started yesterday.

## 2023-11-07 NOTE — Discharge Instructions (Addendum)
 Recommend 1000 mg of Tylenol every 6 hours as needed for pain.  Recommend 400 mg ibuprofen  every 8 hours as needed for pain.  If strep test is positive we will call in antibiotic to your pharmacy.  I have treated you with a long-acting steroid for what I suspect is a viral process.  Follow-up with primary care return if symptoms worsen.

## 2023-11-07 NOTE — ED Provider Notes (Signed)
  EMERGENCY DEPARTMENT AT [facility] Provider Note   CSN: [id number] Arrival date & time: 11/07/23  9077     Patient presents with: Sore Throat   Ariel Brown is a 33 y.o. female.   Patient here with sore throat and congestion.  Her son sick with similar symptoms.  Started yesterday.  Nothing makes it worse or better.  Denies any major medical problems.  Denies any weakness numbness tingling.  No sputum production.  Denies any abdominal pain nausea vomiting.  No diarrhea.  The history is provided by the patient.       Prior to Admission medications   Medication Sig Start Date End Date Taking? Authorizing Provider  diphenhydrAMINE  (BENADRYL ) 25 MG tablet Take 1 tablet (25 mg total) by mouth every 6 (six) hours as needed. 08/20/22   Rancour, Garnette, MD  famotidine  (PEPCID ) 20 MG tablet Take 1 tablet (20 mg total) by mouth daily. 08/20/22   Rancour, Garnette, MD  hydrocortisone  cream 1 % Apply to affected area 2 times daily 08/20/22   Rancour, Garnette, MD  NIFEdipine  (PROCARDIA -XL/NIFEDICAL-XL) 30 MG 24 hr tablet Take 1 tablet (30 mg total) by mouth daily. 07/01/22   Beverley Leita LABOR, PA-C  oseltamivir  (TAMIFLU ) 75 MG capsule Take 1 capsule (75 mg total) by mouth every 12 (twelve) hours. 03/08/23   Bauer, Collin S, PA-C    Allergies: Meloxicam    Review of Systems  Updated Vital Signs BP (!) 187/122 (BP Location: Right Arm)   Pulse 86   Temp 98.7 F (37.1 C) (Oral)   Resp 20   SpO2 100%   Physical Exam Vitals and nursing note reviewed.  Constitutional:      General: She is not in acute distress.    Appearance: She is well-developed.  HENT:     Head: Normocephalic and atraumatic.     Right Ear: Tympanic membrane normal.     Left Ear: Tympanic membrane normal.     Nose: Congestion present.     Mouth/Throat:     Mouth: No oral lesions.     Pharynx: Posterior oropharyngeal erythema present. No pharyngeal swelling or oropharyngeal exudate.      Tonsils: No tonsillar exudate or tonsillar abscesses.  Eyes:     Conjunctiva/sclera: Conjunctivae normal.  Cardiovascular:     Rate and Rhythm: Normal rate and regular rhythm.     Heart sounds: No murmur heard. Pulmonary:     Effort: Pulmonary effort is normal. No respiratory distress.     Breath sounds: Normal breath sounds.  Abdominal:     Palpations: Abdomen is soft.     Tenderness: There is no abdominal tenderness.  Musculoskeletal:        General: No swelling.     Cervical back: Neck supple.  Skin:    General: Skin is warm and dry.     Capillary Refill: Capillary refill takes less than 2 seconds.  Neurological:     General: No focal deficit present.     Mental Status: She is alert and oriented to person, place, and time.  Psychiatric:        Mood and Affect: Mood normal.     (all labs ordered are listed, but only abnormal results are displayed) Labs Reviewed  GROUP A STREP BY PCR  RESP PANEL BY RT-PCR (RSV, FLU A&B, COVID)  RVPGX2    EKG: None  Radiology: No results found.   Procedures   Medications Ordered in the ED  dexamethasone  (DECADRON ) tablet 10  mg (has no administration in time range)                                    Medical Decision Making Risk Prescription drug management.   Ariel Brown is here with congestion and sore throat.  Family member with the same.  Unremarkable vitals.  No fever.  Patient denies any nausea vomiting diarrhea.  No major medical problems.  Nasal congestion on exam.  Little bit of erythema to the back of the throat but do not see any exudates trismus drooling or submandibular swelling.  Very well-appearing.  Overall I suspect a viral process.  Will treat with Decadron  for symptomatic support.  Recommend Tylenol and ibuprofen  at home.  Will send in antibiotic if strep test is positive.  COVID flu RSV testing also sent.  Supportive care at home.  Told to return if symptoms worsen.  Follow-up with primary care if  needed.  This chart was dictated using voice recognition software.  Despite best efforts to proofread,  errors can occur which can change the documentation meaning.      Final diagnoses:  Sore throat    ED Discharge Orders     None          Ruthe Cornet, DO 11/07/23 [phone]
